# Patient Record
Sex: Female | Born: 1955 | Race: White | Hispanic: No | Smoking: Never smoker
Health system: Southern US, Community
[De-identification: ages and names within clinical notes are randomized; demographics above are authoritative.]

## PROBLEM LIST (undated history)

## (undated) DIAGNOSIS — R51 Headache: Secondary | ICD-10-CM

## (undated) DIAGNOSIS — K219 Gastro-esophageal reflux disease without esophagitis: Secondary | ICD-10-CM

## (undated) DIAGNOSIS — C801 Malignant (primary) neoplasm, unspecified: Secondary | ICD-10-CM

## (undated) DIAGNOSIS — Z1371 Encounter for nonprocreative screening for genetic disease carrier status: Secondary | ICD-10-CM

## (undated) DIAGNOSIS — T4145XA Adverse effect of unspecified anesthetic, initial encounter: Secondary | ICD-10-CM

## (undated) DIAGNOSIS — T8859XA Other complications of anesthesia, initial encounter: Secondary | ICD-10-CM

## (undated) DIAGNOSIS — I10 Essential (primary) hypertension: Secondary | ICD-10-CM

## (undated) DIAGNOSIS — K635 Polyp of colon: Secondary | ICD-10-CM

## (undated) DIAGNOSIS — M1990 Unspecified osteoarthritis, unspecified site: Secondary | ICD-10-CM

## (undated) DIAGNOSIS — Z8041 Family history of malignant neoplasm of ovary: Secondary | ICD-10-CM

## (undated) DIAGNOSIS — R112 Nausea with vomiting, unspecified: Secondary | ICD-10-CM

## (undated) DIAGNOSIS — Z9889 Other specified postprocedural states: Secondary | ICD-10-CM

## (undated) DIAGNOSIS — Z800 Family history of malignant neoplasm of digestive organs: Secondary | ICD-10-CM

## (undated) DIAGNOSIS — R519 Headache, unspecified: Secondary | ICD-10-CM

## (undated) HISTORY — PX: TUBAL LIGATION: SHX77

## (undated) HISTORY — DX: Encounter for nonprocreative screening for genetic disease carrier status: Z13.71

## (undated) HISTORY — PX: BACK SURGERY: SHX140

## (undated) HISTORY — PX: BREAST SURGERY: SHX581

## (undated) HISTORY — DX: Family history of malignant neoplasm of digestive organs: Z80.0

## (undated) HISTORY — DX: Polyp of colon: K63.5

## (undated) HISTORY — PX: REDUCTION MAMMAPLASTY: SUR839

## (undated) HISTORY — DX: Family history of malignant neoplasm of ovary: Z80.41

---

## 1998-12-16 ENCOUNTER — Other Ambulatory Visit: Admission: RE | Admit: 1998-12-16 | Discharge: 1998-12-16 | Payer: Self-pay | Admitting: Plastic Surgery

## 2002-07-14 ENCOUNTER — Encounter: Payer: Self-pay | Admitting: Neurosurgery

## 2002-07-14 ENCOUNTER — Encounter: Admission: RE | Admit: 2002-07-14 | Discharge: 2002-07-14 | Payer: Self-pay | Admitting: Neurosurgery

## 2002-08-06 ENCOUNTER — Encounter: Admission: RE | Admit: 2002-08-06 | Discharge: 2002-08-06 | Payer: Self-pay | Admitting: Neurosurgery

## 2002-08-06 ENCOUNTER — Encounter: Payer: Self-pay | Admitting: Neurosurgery

## 2002-08-21 ENCOUNTER — Encounter: Admission: RE | Admit: 2002-08-21 | Discharge: 2002-08-21 | Payer: Self-pay | Admitting: Neurosurgery

## 2002-08-21 ENCOUNTER — Encounter: Payer: Self-pay | Admitting: Neurosurgery

## 2004-10-01 HISTORY — PX: LEEP: SHX91

## 2004-12-13 ENCOUNTER — Ambulatory Visit: Payer: Self-pay | Admitting: Gynecologic Oncology

## 2005-11-27 ENCOUNTER — Ambulatory Visit: Payer: Self-pay

## 2006-12-10 ENCOUNTER — Ambulatory Visit: Payer: Self-pay

## 2007-11-24 ENCOUNTER — Ambulatory Visit: Payer: Self-pay | Admitting: Gastroenterology

## 2007-12-12 ENCOUNTER — Ambulatory Visit: Payer: Self-pay

## 2009-03-11 ENCOUNTER — Ambulatory Visit: Payer: Self-pay

## 2010-09-13 ENCOUNTER — Ambulatory Visit: Payer: Self-pay

## 2011-11-13 ENCOUNTER — Ambulatory Visit: Payer: Self-pay

## 2012-11-27 ENCOUNTER — Ambulatory Visit: Payer: Self-pay | Admitting: Internal Medicine

## 2014-05-13 ENCOUNTER — Ambulatory Visit: Payer: Self-pay

## 2016-03-01 ENCOUNTER — Emergency Department: Payer: BC Managed Care – PPO

## 2016-03-01 ENCOUNTER — Emergency Department
Admission: EM | Admit: 2016-03-01 | Discharge: 2016-03-01 | Disposition: A | Discharge status: Home / Self Care | Payer: BC Managed Care – PPO | Attending: Emergency Medicine | Admitting: Emergency Medicine

## 2016-03-01 ENCOUNTER — Encounter: Payer: Self-pay | Admitting: Emergency Medicine

## 2016-03-01 DIAGNOSIS — K8050 Calculus of bile duct without cholangitis or cholecystitis without obstruction: Secondary | ICD-10-CM | POA: Diagnosis not present

## 2016-03-01 DIAGNOSIS — K8020 Calculus of gallbladder without cholecystitis without obstruction: Secondary | ICD-10-CM | POA: Diagnosis not present

## 2016-03-01 DIAGNOSIS — I10 Essential (primary) hypertension: Secondary | ICD-10-CM | POA: Diagnosis not present

## 2016-03-01 DIAGNOSIS — R1011 Right upper quadrant pain: Secondary | ICD-10-CM | POA: Diagnosis present

## 2016-03-01 HISTORY — DX: Essential (primary) hypertension: I10

## 2016-03-01 LAB — URINALYSIS COMPLETE WITH MICROSCOPIC (ARMC ONLY)
BILIRUBIN URINE: NEGATIVE
Bacteria, UA: NONE SEEN
GLUCOSE, UA: NEGATIVE mg/dL
KETONES UR: NEGATIVE mg/dL
NITRITE: NEGATIVE
PROTEIN: NEGATIVE mg/dL
SPECIFIC GRAVITY, URINE: 1.013 (ref 1.005–1.030)
pH: 6 (ref 5.0–8.0)

## 2016-03-01 LAB — COMPREHENSIVE METABOLIC PANEL
ALT: 23 U/L (ref 14–54)
AST: 22 U/L (ref 15–41)
Albumin: 4.3 g/dL (ref 3.5–5.0)
Alkaline Phosphatase: 55 U/L (ref 38–126)
Anion gap: 9 (ref 5–15)
BILIRUBIN TOTAL: 0.7 mg/dL (ref 0.3–1.2)
BUN: 11 mg/dL (ref 6–20)
CHLORIDE: 108 mmol/L (ref 101–111)
CO2: 23 mmol/L (ref 22–32)
CREATININE: 0.67 mg/dL (ref 0.44–1.00)
Calcium: 9 mg/dL (ref 8.9–10.3)
Glucose, Bld: 96 mg/dL (ref 65–99)
Potassium: 3.5 mmol/L (ref 3.5–5.1)
Sodium: 140 mmol/L (ref 135–145)
TOTAL PROTEIN: 7.3 g/dL (ref 6.5–8.1)

## 2016-03-01 LAB — CBC
HCT: 41.6 % (ref 35.0–47.0)
Hemoglobin: 14 g/dL (ref 12.0–16.0)
MCH: 28.4 pg (ref 26.0–34.0)
MCHC: 33.7 g/dL (ref 32.0–36.0)
MCV: 84.3 fL (ref 80.0–100.0)
PLATELETS: 320 10*3/uL (ref 150–440)
RBC: 4.93 MIL/uL (ref 3.80–5.20)
RDW: 13.6 % (ref 11.5–14.5)
WBC: 7.2 10*3/uL (ref 3.6–11.0)

## 2016-03-01 LAB — LIPASE, BLOOD: LIPASE: 34 U/L (ref 11–51)

## 2016-03-01 MED ORDER — HYDROCODONE-ACETAMINOPHEN 5-325 MG PO TABS: 1.0000 | ORAL_TABLET | ORAL | Status: DC | PRN

## 2016-03-01 MED ORDER — DOCUSATE SODIUM 100 MG PO CAPS: ORAL_CAPSULE | ORAL | Status: DC

## 2016-03-01 MED ORDER — ONDANSETRON HCL 4 MG PO TABS
ORAL_TABLET | ORAL | Status: DC
Start: 2016-03-01 — End: 2016-04-27

## 2016-03-01 NOTE — ED Notes (Signed)
Pt here with c/o RUQ abd pain worsening over the past week, denies N,V,D. Does have her gallbladder. States she also bumped her leg a few times up against the bed last night, and today, the front of her right leg feels numb. Appears in NAD.

## 2016-03-01 NOTE — Discharge Instructions (Signed)
You have been seen in the Emergency Department (ED) for abdominal pain.  Your evaluation suggests that your pain is caused by gallstones.  Fortunately you do not need immediate surgery at this time, but it is important that you follow up with a surgeon as an outpatient; typically surgical removal of the gallbladder is the only thing that will definitively fix your issue.  Read through the included information about a bland diet, and use any prescribed medications as instructed.  Avoid smoking and alcohol use.  Please follow up as instructed above regarding todays emergent visit and the symptoms that are bothering you.  Take Norco as prescribed. Do not drink alcohol, drive or participate in any other potentially dangerous activities while taking this medication as it may make you sleepy. Do not take this medication with any other sedating medications, either prescription or over-the-counter. If you were prescribed Percocet or Vicodin, do not take these with acetaminophen (Tylenol) as it is already contained within these medications.   This medication is an opiate (or narcotic) pain medication and can be habit forming.  Use it as little as possible to achieve adequate pain control.  Do not use or use it with extreme caution if you have a history of opiate abuse or dependence.  If you are on a pain contract with your primary care doctor or a pain specialist, be sure to let them know you were prescribed this medication today from the [facility] Emergency Department.  This medication is intended for your use only - do not give any to anyone else and keep it in a secure place where nobody else, especially children, have access to it.  It will also cause or worsen constipation, so you may want to consider taking an over-the-counter stool softener while you are taking this medication.  Return to the ED if your abdominal pain worsens or fails to improve, you develop bloody vomiting, bloody diarrhea, you are  unable to tolerate fluids due to vomiting, fever greater than 101, or other symptoms that concern you.   Biliary Colic Biliary colic is a pain in the upper abdomen. The pain:  Is usually felt on the right side of the abdomen, but it may also be felt in the center of the abdomen, just below the breastbone (sternum).  May spread back toward the right shoulder blade.  May be steady or irregular.  May be accompanied by nausea and vomiting. Most of the time, the pain goes away in 1-5 hours. After the most intense pain passes, the abdomen may continue to ache mildly for about 24 hours. Biliary colic is caused by a blockage in the bile duct. The bile duct is a pathway that carries bile--a liquid that helps to digest fats--from the gallbladder to the small intestine. Biliary colic usually occurs after eating, when the digestive system demands bile. The pain develops when muscle cells contract forcefully to try to move the blockage so that bile can get by. HOME CARE INSTRUCTIONS  Take medicines only as directed by your health care provider.  Drink enough fluid to keep your urine clear or pale yellow.  Avoid fatty, greasy, and fried foods. These kinds of foods increase your body's demand for bile.  Avoid any foods that make your pain worse.  Avoid overeating.  Avoid having a large meal after fasting. SEEK MEDICAL CARE IF:  You develop a fever.  Your pain gets worse.  You vomit.  You develop nausea that prevents you from eating and drinking. SEEK  IMMEDIATE MEDICAL CARE IF:  You suddenly develop a fever and shaking chills.  You develop a yellowish discoloration (jaundice) of:  Skin.  Whites of the eyes.  Mucous membranes.  You have continuous or severe pain that is not relieved with medicines.  You have nausea and vomiting that is not relieved with medicines.  You develop dizziness or you faint.   This information is not intended to replace advice given to you by your  health care provider. Make sure you discuss any questions you have with your health care provider.   Document Released: 02/18/2006 Document Revised: 02/01/2015 Document Reviewed: 06/29/2014 Elsevier Interactive Patient Education 2016 [employer].  Cholelithiasis Cholelithiasis (also called gallstones) is a form of gallbladder disease in which gallstones form in your gallbladder. The gallbladder is an organ that stores bile made in the liver, which helps digest fats. Gallstones begin as small crystals and slowly grow into stones. Gallstone pain occurs when the gallbladder spasms and a gallstone is blocking the duct. Pain can also occur when a stone passes out of the duct.  RISK FACTORS  Being female.   Having multiple pregnancies. Health care providers sometimes advise removing diseased gallbladders before future pregnancies.   Being obese.  Eating a diet heavy in fried foods and fat.   Being older than 1 years and increasing age.   Prolonged use of medicines containing female hormones.   Having diabetes mellitus.   Rapidly losing weight.   Having a family history of gallstones (heredity).  SYMPTOMS  Nausea.   Vomiting.  Abdominal pain.   Yellowing of the skin (jaundice).   Sudden pain. It may persist from several minutes to several hours.  Fever.   Tenderness to the touch. In some cases, when gallstones do not move into the bile duct, people have no pain or symptoms. These are called "silent" gallstones.  TREATMENT Silent gallstones do not need treatment. In severe cases, emergency surgery may be required. Options for treatment include:  Surgery to remove the gallbladder. This is the most common treatment.  Medicines. These do not always work and may take 6-12 months or more to work.  Shock wave treatment (extracorporeal biliary lithotripsy). In this treatment an ultrasound machine sends shock waves to the gallbladder to break gallstones into smaller  pieces that can pass into the intestines or be dissolved by medicine. HOME CARE INSTRUCTIONS   Only take over-the-counter or prescription medicines for pain, discomfort, or fever as directed by your health care provider.   Follow a low-fat diet until seen again by your health care provider. Fat causes the gallbladder to contract, which can result in pain.   Follow up with your health care provider as directed. Attacks are almost always recurrent and surgery is usually required for permanent treatment.  SEEK IMMEDIATE MEDICAL CARE IF:   Your pain increases and is not controlled by medicines.   You have a fever or persistent symptoms for more than 2-3 days.   You have a fever and your symptoms suddenly get worse.   You have persistent nausea and vomiting.  MAKE SURE YOU:   Understand these instructions.  Will watch your condition.  Will get help right away if you are not doing well or get worse.   This information is not intended to replace advice given to you by your health care provider. Make sure you discuss any questions you have with your health care provider.   Document Released: 09/13/2005 Document Revised: 05/20/2013 Document Reviewed: 03/11/2013 Elsevier Interactive  Patient Education 2016 [employer].

## 2016-03-01 NOTE — ED Provider Notes (Signed)
[facility] Emergency Department Provider Note  ____________________________________________  Time seen: Approximately 7:13 PM  I have reviewed the triage vital signs and the nursing notes.   HISTORY  Chief Complaint Abdominal Pain    HPI Betty Parsons is a 60 y.o. female with no significant past medical history who presents for evaluation of right upper quadrant and epigastric abdominal pain that has been constant for the last 3 days.  She reports that she has not felt "quite right" for several weeks intermittently but that the pain started gradually 3 days ago and has been constant.  She feels worse when she is seated and slightly better when she lies down.  She feels bloated like she has eaten a big meal but she has had decreased appetite.  She denies nausea, vomiting, fever/chills, chest pain, shortness of breath, diarrhea, constipation, dysuria.  She describes the pain as moderate in intensity and describes it as both a dull ache and sometimes a burning sensation.  Her only other symptom that she mentioned is some numbness in the anterior portion of her right lower leg that has been new over the last several days.  She has not had any numbness or tingling in her arms and has had no weakness in any of her extremities.   Past Medical History  Diagnosis Date  . Hypertension     There are no active problems to display for this patient.   Past Surgical History  Procedure Laterality Date  . Back surgery    . Breast surgery      Current Outpatient Rx  Name  Route  Sig  Dispense  Refill  . docusate sodium (COLACE) 100 MG capsule      Take 1 tablet once or twice daily as needed for constipation while taking narcotic pain medicine   30 capsule   0   . HYDROcodone-acetaminophen (NORCO/VICODIN) 5-325 MG tablet   Oral   Take 1-2 tablets by mouth every 4 (four) hours as needed for moderate pain.   15 tablet   0   . ondansetron (ZOFRAN) 4 MG  tablet      Take 1-2 tabs by mouth every 8 hours as needed for nausea/vomiting   30 tablet   0     Allergies Review of patient's allergies indicates no known allergies.  No family history on file.  Social History Social History  Substance Use Topics  . Smoking status: Never Smoker   . Smokeless tobacco: None  . Alcohol Use: Yes     Comment: occas.     Review of Systems Constitutional: No fever/chills Eyes: No visual changes. ENT: No sore throat. Cardiovascular: Denies chest pain. Respiratory: Denies shortness of breath. Gastrointestinal: +abdominal pain.  No nausea, no vomiting.  No diarrhea.  No constipation. Genitourinary: Negative for dysuria. Musculoskeletal: Negative for back pain. Skin: Negative for rash. Neurological: Some numbness in her right lower anterior leg, otherwise no focal neuro deficits  10-point ROS otherwise negative.  ____________________________________________   PHYSICAL EXAM:  VITAL SIGNS: ED Triage Vitals  Enc Vitals Group     BP 03/01/16 1828 174/94 mmHg     Pulse Rate 03/01/16 1828 85     Resp 03/01/16 1828 18     Temp 03/01/16 1828 98.3 F (36.8 C)     Temp Source 03/01/16 1828 Oral     SpO2 03/01/16 1828 97 %     Weight 03/01/16 1828 180 lb (81.647 kg)     Height 03/01/16 1828  5\' 5"  (1.651 m)     Head Cir --      Peak Flow --      Pain Score 03/01/16 1829 7     Pain Loc --      Pain Edu? --      Excl. in [place]? --     Constitutional: Alert and oriented. Well appearing and in no acute distress. Eyes: Conjunctivae are normal. PERRL. EOMI. Head: Atraumatic. Nose: No congestion/rhinnorhea. Mouth/Throat: Mucous membranes are moist.  Oropharynx non-erythematous. Neck: No stridor.  No meningeal signs.   Cardiovascular: Normal rate, regular rhythm. Good peripheral circulation. Grossly normal heart sounds.   Respiratory: Normal respiratory effort.  No retractions. Lungs CTAB. Gastrointestinal: Tenderness to palpation of the  epigastrium and some to the right upper quadrant but no positive Murphy sign to speak of.  No lower abdominal tenderness. Musculoskeletal: No lower extremity tenderness nor edema. No gross deformities of extremities. Neurologic:  Normal speech and language. No gross focal neurologic deficits are appreciated.  Skin:  Skin is warm, dry and intact. No rash noted. Psychiatric: Mood and affect are normal. Speech and behavior are normal.  ____________________________________________   LABS (all labs ordered are listed, but only abnormal results are displayed)  Labs Reviewed  URINALYSIS COMPLETEWITH MICROSCOPIC ([place]) - Abnormal; Notable for the following:    Color, Urine YELLOW (*)    APPearance CLEAR (*)    Hgb urine dipstick 1+ (*)    Leukocytes, UA TRACE (*)    Squamous Epithelial / LPF 0-5 (*)    All other components within normal limits  LIPASE, BLOOD  COMPREHENSIVE METABOLIC PANEL  CBC   ____________________________________________  EKG  ED ECG REPORT I, Fremon Zacharia, the attending physician, personally viewed and interpreted this ECG.  Date: 03/01/2016 EKG Time: 18:32 Rate: 69 Rhythm: normal sinus rhythm QRS Axis: normal Intervals: normal ST/T Wave abnormalities: normal Conduction Disturbances: none Narrative Interpretation: unremarkable  ____________________________________________  RADIOLOGY   US Abdomen Complete  03/01/2016  CLINICAL DATA:  Epigastric and right upper quadrant pain. EXAM: ABDOMEN ULTRASOUND COMPLETE COMPARISON:  None. FINDINGS: Gallbladder: Multiple dependent gallstones with some sludge. No wall thickening or pericholecystic fluid. No evidence of acute cholecystitis. Common bile duct: Diameter: 4.7 mm Liver: Increased echogenicity.  No mass or focal lesion. IVC: No abnormality visualized. Pancreas: Visualized portion unremarkable. Spleen: Size and appearance within normal limits. Right Kidney: Length: 10.5 cm. Normal parenchymal echogenicity. 1  cm lower pole cyst. 4 mm midpole nonobstructing stone. No hydronephrosis. Left Kidney: Length: 11.3 cm. Normal parenchymal echogenicity. Mild dilation of the left renal pelvis. No mass or stone. Abdominal aorta: No aneurysm.  Atherosclerotic irregularity noted. Other findings: None. IMPRESSION: 1. No acute findings. No findings to explain epigastric and right upper quadrant pain. 2. Cholelithiasis without evidence of acute cholecystitis. 3. Hepatic steatosis. 4. Normal caliber abdominal aorta. 5. Small right renal cyst and intrarenal calcification. Mild dilation of the left renal pelvis, likely physiologic. Electronically Signed   By: Lajean Manes M.D.   On: 03/01/2016 21:09    ____________________________________________   PROCEDURES  Procedure(s) performed: None  Critical Care performed: No ____________________________________________   INITIAL IMPRESSION / ASSESSMENT AND PLAN / ED COURSE  Pertinent labs & imaging results that were available during my care of the patient were reviewed by me and considered in my medical decision making (see chart for details).  By far the top by tomorrow my differential diagnosis is gallbladder disease.  Labs unremarkable.  Will evaluate with ultrasound, but will also  check aorta given (likely unrelated) leg numbness.  ----------------------------------------- 9:30 PM on 03/01/2016 -----------------------------------------  Gallstones w/ sludge, but no cholecystitis.  No suggestion of choledocholithiasis.    Patient comfortable, no pain currently.  Had my usual and customary discussion with the patient.  She will follow up with surgery.     ____________________________________________  FINAL CLINICAL IMPRESSION(S) / ED DIAGNOSES  Final diagnoses:  Biliary colic  Cholelithiasis without cholecystitis     MEDICATIONS GIVEN DURING THIS VISIT:  Medications - No data to display   NEW OUTPATIENT MEDICATIONS STARTED DURING THIS VISIT:  New  Prescriptions   DOCUSATE SODIUM (COLACE) 100 MG CAPSULE    Take 1 tablet once or twice daily as needed for constipation while taking narcotic pain medicine   HYDROCODONE-ACETAMINOPHEN (NORCO/VICODIN) 5-325 MG TABLET    Take 1-2 tablets by mouth every 4 (four) hours as needed for moderate pain.   ONDANSETRON (ZOFRAN) 4 MG TABLET    Take 1-2 tabs by mouth every 8 hours as needed for nausea/vomiting      Note:  This document was prepared using Dragon voice recognition software and may include unintentional dictation errors.   Hinda Kehr, MD 03/01/16 2131

## 2016-03-01 NOTE — ED Notes (Signed)
Pt presents with RUQ pain in last couple of weeks, worsening in last few days. Pt reports feeling bloated, like she has eaten a big meal, but she has not been eating. Pt has gallbladder. Pt alert & oriented with NAD Noted.

## 2016-03-07 ENCOUNTER — Telehealth: Payer: Self-pay

## 2016-03-07 NOTE — Telephone Encounter (Signed)
Call to patient to schedule a follow-up appointment from Emergency Room Visit (6/1) per Dr. Azalee Course. Patient states that she has already followed up with PCP, Dr. Emily Filbert and they are going to "watch and see" right now. I explained to patient that we would be glad to see her if she needs any follow-up in the future for her gallstones.

## 2016-03-10 ENCOUNTER — Emergency Department
Admission: EM | Admit: 2016-03-10 | Discharge: 2016-03-10 | Disposition: A | Discharge status: Home / Self Care | Payer: BC Managed Care – PPO | Attending: Emergency Medicine | Admitting: Emergency Medicine

## 2016-03-10 DIAGNOSIS — Z79899 Other long term (current) drug therapy: Secondary | ICD-10-CM | POA: Insufficient documentation

## 2016-03-10 DIAGNOSIS — I10 Essential (primary) hypertension: Secondary | ICD-10-CM | POA: Diagnosis not present

## 2016-03-10 DIAGNOSIS — Z048 Encounter for examination and observation for other specified reasons: Secondary | ICD-10-CM | POA: Diagnosis present

## 2016-03-10 NOTE — ED Notes (Signed)
Pt states she had her bp meds changed on Tuesday and placed on zoloft for anxiety. She checks her bp approx 3 times a day. Checked her bp while at a store and noticed the bottom number was high. vss in room.

## 2016-03-10 NOTE — Discharge Instructions (Signed)
As we discussed, though you do have high blood pressure (hypertension), fortunately it is not immediately dangerous at this time and does not need emergency intervention or admission to the hospital.  If we add to or change your regular medications, we may cause more harm than good - it is more appropriate for your primary care doctor to evaluate you in clinic and decide if any medication changes are needed.  Please follow up in clinic as recommended in these papers. ° °Return to the Emergency Department (ED) if you experience any chest pain/pressure/tightness, difficulty breathing, or sudden sweating, or other symptoms that concern you. ° ° °Hypertension °Hypertension, commonly called high blood pressure, is when the force of blood pumping through your arteries is too strong. Your arteries are the blood vessels that carry blood from your heart throughout your body. A blood pressure reading consists of a higher number over a lower number, such as 110/72. The higher number (systolic) is the pressure inside your arteries when your heart pumps. The lower number (diastolic) is the pressure inside your arteries when your heart relaxes. Ideally you want your blood pressure below 120/80. °Hypertension forces your heart to work harder to pump blood. Your arteries may become narrow or stiff. Having untreated or uncontrolled hypertension can cause heart attack, stroke, kidney disease, and other problems. °RISK FACTORS °Some risk factors for high blood pressure are controllable. Others are not.  °Risk factors you cannot control include:  °· Race. You may be at higher risk if you are African American. °· Age. Risk increases with age. °· Gender. Men are at higher risk than women before age 45 years. After age 65, women are at higher risk than men. °Risk factors you can control include: °· Not getting enough exercise or physical activity. °· Being overweight. °· Getting too much fat, sugar, calories, or salt in your  diet. °· Drinking too much alcohol. °SIGNS AND SYMPTOMS °Hypertension does not usually cause signs or symptoms. Extremely high blood pressure (hypertensive crisis) may cause headache, anxiety, shortness of breath, and nosebleed. °DIAGNOSIS °To check if you have hypertension, your health care provider will measure your blood pressure while you are seated, with your arm held at the level of your heart. It should be measured at least twice using the same arm. Certain conditions can cause a difference in blood pressure between your right and left arms. A blood pressure reading that is higher than normal on one occasion does not mean that you need treatment. If it is not clear whether you have high blood pressure, you may be asked to return on a different day to have your blood pressure checked again. Or, you may be asked to monitor your blood pressure at home for 1 or more weeks. °TREATMENT °Treating high blood pressure includes making lifestyle changes and possibly taking medicine. Living a healthy lifestyle can help lower high blood pressure. You may need to change some of your habits. °Lifestyle changes may include: °· Following the DASH diet. This diet is high in fruits, vegetables, and whole grains. It is low in salt, red meat, and added sugars. °· Keep your sodium intake below 2,300 mg per day. °· Getting at least 30-45 minutes of aerobic exercise at least 4 times per week. °· Losing weight if necessary. °· Not smoking. °· Limiting alcoholic beverages. °· Learning ways to reduce stress. °Your health care provider may prescribe medicine if lifestyle changes are not enough to get your blood pressure under control, and if one of   the following is true: °· You are 18-59 years of age and your systolic blood pressure is above 140. °· You are 60 years of age or older, and your systolic blood pressure is above 150. °· Your diastolic blood pressure is above 90. °· You have diabetes, and your systolic blood pressure is over  140 or your diastolic blood pressure is over 90. °· You have kidney disease and your blood pressure is above 140/90. °· You have heart disease and your blood pressure is above 140/90. °Your personal target blood pressure may vary depending on your medical conditions, your age, and other factors. °HOME CARE INSTRUCTIONS °· Have your blood pressure rechecked as directed by your health care provider.   °· Take medicines only as directed by your health care provider. Follow the directions carefully. Blood pressure medicines must be taken as prescribed. The medicine does not work as well when you skip doses. Skipping doses also puts you at risk for problems. °· Do not smoke.   °· Monitor your blood pressure at home as directed by your health care provider.  °SEEK MEDICAL CARE IF:  °· You think you are having a reaction to medicines taken. °· You have recurrent headaches or feel dizzy. °· You have swelling in your ankles. °· You have trouble with your vision. °SEEK IMMEDIATE MEDICAL CARE IF: °· You develop a severe headache or confusion. °· You have unusual weakness, numbness, or feel faint. °· You have severe chest or abdominal pain. °· You vomit repeatedly. °· You have trouble breathing. °MAKE SURE YOU:  °· Understand these instructions. °· Will watch your condition. °· Will get help right away if you are not doing well or get worse. °  °This information is not intended to replace advice given to you by your health care provider. Make sure you discuss any questions you have with your health care provider. °  °Document Released: 09/17/2005 Document Revised: 02/01/2015 Document Reviewed: 07/10/2013 °Elsevier Interactive Patient Education ©2016 [employer] ° °

## 2016-03-10 NOTE — ED Notes (Addendum)
Patient reports recently started a new blood pressure medication last week and medication for anxiety.  This evening noticed blood pressure was elevated and has become more anxious.  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

## 2016-03-10 NOTE — ED Provider Notes (Signed)
[facility] Emergency Department Provider Note  ____________________________________________  Time seen: Approximately 9:40 PM  I have reviewed the triage vital signs and the nursing notes.   HISTORY  Chief Complaint Hypertension    HPI Betty Parsons is a 60 y.o. female presents for elevated blood pressure. She's been working with Dr. Sabra Heck regarding high blood pressure recently. She was just started on new hydrochlorothiazide last week as well as an anti-anxiety/Zoloft yesterday by Dr. Sabra Heck.  Patient reports that at home she is been checking her blood pressures 3-4 times a day and today she was noticing occasionally 160s over 100. This prompted concern for her that she does not wish to "have a stroke" and she is comfortable evaluation. She has not had a headache, numbness, tingling, chest pain weakness or other concerns but just reports her blood pressure has been high on multiple readings. She has however only been taking a half tablet of hydrochlorothiazide as her blood pressure was low just a few days ago after she had seen Dr. Sabra Heck.  She does report being under stress, and starting Zoloft yesterday.   Past Medical History  Diagnosis Date  . Hypertension     There are no active problems to display for this patient.   Past Surgical History  Procedure Laterality Date  . Back surgery    . Breast surgery      Current Outpatient Rx  Name  Route  Sig  Dispense  Refill  . docusate sodium (COLACE) 100 MG capsule      Take 1 tablet once or twice daily as needed for constipation while taking narcotic pain medicine   30 capsule   0   . HYDROcodone-acetaminophen (NORCO/VICODIN) 5-325 MG tablet   Oral   Take 1-2 tablets by mouth every 4 (four) hours as needed for moderate pain.   15 tablet   0   . ondansetron (ZOFRAN) 4 MG tablet      Take 1-2 tabs by mouth every 8 hours as needed for nausea/vomiting   30 tablet   0      Allergies Review of patient's allergies indicates no known allergies.  No family history on file.  Social History Social History  Substance Use Topics  . Smoking status: Never Smoker   . Smokeless tobacco: Not on file  . Alcohol Use: Yes     Comment: occas.     Review of Systems Constitutional: No fever/chills Eyes: No visual changes. ENT: No sore throat. Cardiovascular: Denies chest pain. Respiratory: Denies shortness of breath. Gastrointestinal: No abdominal pain.  No nausea, no vomiting.  No diarrhea.  No constipation. Genitourinary: Negative for dysuria. Musculoskeletal: Negative for back pain. Skin: Negative for rash. Neurological: Negative for headaches, focal weakness or numbness.  10-point ROS otherwise negative.  ____________________________________________   PHYSICAL EXAM:  VITAL SIGNS: ED Triage Vitals  Enc Vitals Group     BP 03/10/16 2007 169/102 mmHg     Pulse Rate 03/10/16 2007 71     Resp 03/10/16 2007 18     Temp 03/10/16 2007 98.2 F (36.8 C)     Temp Source 03/10/16 2007 Oral     SpO2 03/10/16 2007 97 %     Weight 03/10/16 2007 175 lb (79.379 kg)     Height 03/10/16 2007 5\' 5"  (1.651 m)     Head Cir --      Peak Flow --      Pain Score --      Pain Loc --  Pain Edu? --      Excl. in [place]? --    Constitutional: Alert and oriented. Well appearing and in no acute distress. Eyes: Conjunctivae are normal. PERRL. EOMI. Head: Atraumatic. Nose: No congestion/rhinnorhea. Mouth/Throat: Mucous membranes are moist.  Oropharynx non-erythematous. Neck: No stridor.   Cardiovascular: Normal rate, regular rhythm. Grossly normal heart sounds.  Good peripheral circulation. Respiratory: Normal respiratory effort.  No retractions. Lungs CTAB. Gastrointestinal: Soft and nontender. No distention. Had abdominal pain last week, this is improved now. Musculoskeletal: No lower extremity tenderness nor edema.  No joint effusions. Neurologic:  Normal speech  and language. No gross focal neurologic deficits are appreciated. No gait instability. Skin:  Skin is warm, dry and intact. No rash noted. Psychiatric: Mood and affect are normal. Speech and behavior are normal.  ____________________________________________   LABS (all labs ordered are listed, but only abnormal results are displayed)  Labs Reviewed - No data to display ____________________________________________  EKG  ED ECG REPORT I, QUALE, MARK, the attending physician, personally viewed and interpreted this ECG.  Date: 03/10/2016 EKG Time: 2120 Rate: 75 Rhythm: normal sinus rhythm QRS Axis: normal Intervals: normal ST/T Wave abnormalities: normal Conduction Disturbances: none Narrative Interpretation: unremarkable  ____________________________________________  RADIOLOGY   ____________________________________________   PROCEDURES  Procedure(s) performed: None  Critical Care performed: No  ____________________________________________   INITIAL IMPRESSION / ASSESSMENT AND PLAN / ED COURSE  Pertinent labs & imaging results that were available during my care of the patient were reviewed by me and considered in my medical decision making (see chart for details).  Patient presents for evaluation of asymptomatic hypertension at home. Very reassuring exam, normal EKG and no signs or symptoms suggest need for further workup at this time as she is working with Dr. Sabra Heck regarding this. After discussing with the patient and reassurance her blood pressure improved on its own. She has no evidence of hypertensive emergency, no concerning symptoms. No neurologic cardiac or pulmonary symptomatology. I advised her to continue with the medications and adjustments Dr. Ammie Ferrier been making in the set up follow-up with him. The patient understands, is agreeable and seems very reassured after our discussion.  Nonetheless, we did discuss very careful return precautions for which the  patient is agreeable. Return precautions and treatment recommendations and follow-up discussed with the patient who is agreeable with the plan.  ____________________________________________   FINAL CLINICAL IMPRESSION(S) / ED DIAGNOSES  Final diagnoses:  Hypertension, essential      Delman Kitten, MD 03/10/16 2143

## 2016-04-27 ENCOUNTER — Encounter
Admission: RE | Admit: 2016-04-27 | Discharge: 2016-04-27 | Disposition: A | Discharge status: Home / Self Care | Payer: BC Managed Care – PPO | Source: Ambulatory Visit | Attending: Surgery | Admitting: Surgery

## 2016-04-27 HISTORY — DX: Other complications of anesthesia, initial encounter: T88.59XA

## 2016-04-27 HISTORY — DX: Adverse effect of unspecified anesthetic, initial encounter: T41.45XA

## 2016-04-27 HISTORY — DX: Headache: R51

## 2016-04-27 HISTORY — DX: Nausea with vomiting, unspecified: R11.2

## 2016-04-27 HISTORY — DX: Malignant (primary) neoplasm, unspecified: C80.1

## 2016-04-27 HISTORY — DX: Gastro-esophageal reflux disease without esophagitis: K21.9

## 2016-04-27 HISTORY — DX: Headache, unspecified: R51.9

## 2016-04-27 HISTORY — DX: Unspecified osteoarthritis, unspecified site: M19.90

## 2016-04-27 HISTORY — DX: Nausea with vomiting, unspecified: Z98.890

## 2016-04-27 NOTE — Patient Instructions (Signed)
  Your procedure is scheduled on: 05-04-16 Report to Same Day Surgery 2nd floor medical mall To find out your arrival time please call [phone] between 1PM - 3PM on 05-03-16  Remember: Instructions that are not followed completely may result in serious medical risk, up to and including death, or upon the discretion of your surgeon and anesthesiologist your surgery may need to be rescheduled.    _x___ 1. Do not eat food or drink liquids after midnight. No gum chewing or hard candies.     __x__ 2. No Alcohol for 24 hours before or after surgery.   __x__3. No Smoking for 24 prior to surgery.   ____  4. Bring all medications with you on the day of surgery if instructed.    __x__ 5. Notify your doctor if there is any change in your medical condition     (cold, fever, infections).     Do not wear jewelry, make-up, hairpins, clips or nail polish.  Do not wear lotions, powders, or perfumes. You may wear deodorant.  Do not shave 48 hours prior to surgery. Men may shave face and neck.  Do not bring valuables to the hospital.    [facility] is not responsible for any belongings or valuables.               Contacts, dentures or bridgework may not be worn into surgery.  Leave your suitcase in the car. After surgery it may be brought to your room.  For patients admitted to the hospital, discharge time is determined by your treatment team.   Patients discharged the day of surgery will not be allowed to drive home.    Please read over the following fact sheets that you were given:   [facility] Preparing for Surgery and or MRSA Information   _x___ Take these medicines the morning of surgery with A SIP OF WATER:    1. METOPROLOL  2. DULOXETINE  3. MAY TAKE XANAX IF NEEDED AM OF SURGERY  4.  5.  6.  ____ Fleet Enema (as directed)   ____ Use CHG Soap or sage wipes as directed on instruction sheet   ____ Use inhalers on the day of surgery and bring to hospital day of surgery  ____ Stop  metformin 2 days prior to surgery    ____ Take 1/2 of usual insulin dose the night before surgery and none on the morning of  surgery.   ____ Stop aspirin or coumadin, or plavix  _x__ Stop Anti-inflammatories such as Advil, Aleve, Ibuprofen, Motrin, Naproxen,          Naprosyn, Goodies powders or aspirin products. Ok to take Tylenol.   _X___ Stop supplements until after surgery-STOP MELATONIN NOW  ____ Bring C-Pap to the hospital.

## 2016-05-03 NOTE — Pre-Procedure Instructions (Signed)
Component Name Value Ref Range  LV Ejection Fraction (%) 55   Aortic Valve Regurgitation Grade trivial   Aortic Valve Stenosis Grade none   Aortic Valve Max Velocity (m/s) 1.4 m/sec   Aortic Valve Stenosis Mean Gradient (mmHg) 5.0 mmHg   Mitral Valve Regurgitation Grade trivial   Mitral Valve Stenosis Grade none   Tricuspid Valve Regurgitation Grade trivial   Result Narrative  INTERNAL MEDICINE DEPARTMENT SAPHYRA, BOST  [facility] [id number] [place] #: [id number] [place] Betty Parsons [id number]: 12/06/2015 08:35 AM  Adult Female Age: 60 yrs ECHOCARDIOGRAM REPORTOutpatient STUDY:Stress Echo TAPE:[id number]::[id number]  ECHO:Yes DOPPLER:Yes FILE:[id number]:MCLAUGHLIN, MIRIAM KLEM COLOR:YesCONTRAST:NoMACHINE:PhilipsHeight: 44 in RV BIOPSY:No 3D:No SOUND QLTY:Moderate Weight: 182 lb  MEDIUM:None  BSA: 1.9 m2 ___________________________________________________________________________________________ HISTORY:Hypertension, SOB  REASON:Assess, LV function  Indication:Acute chest pain, unspecified [R07.9 (ICD-10-CM)]  ___________________________________________________________________________________________ STRESS ECHOCARDIOGRAPHY   Protocol:Treadmill (Accelerated [place]) Drugs:None Target Heart Rate:135 bpmMaximum Predicted Heart Rate: 159 bpm  +-------------------+-------------------------+-------------------------+------------+  Stage  Duration (mm:ss) Heart Rate (bpm)  BP  +-------------------+-------------------------+-------------------------+------------+ RESTING 75  134/94  +-------------------+-------------------------+-------------------------+------------+ EXERCISE  4:28 146  / +-------------------+-------------------------+-------------------------+------------+ RECOVERY  5:2683  154/95  +-------------------+-------------------------+-------------------------+------------+  Stress Duration:9:54 mm:ss Max Stress H.R.:146 bpmTarget Heart Rate Achieved: Yes Maximum workload of 9.40 METS was achieved during exercise   ___________________________________________________________________________________________ WALL SEGMENT CHANGES  RestStress Anterior Septum Basal:NormalHyperkinetic [id number]  Apical:NormalHyperkinetic  Anterior Wall Basal:NormalHyperkinetic [id number]  Apical:NormalHyperkinetic   Lateral Wall Basal:NormalHyperkinetic [id number]  Apical:NormalHyperkinetic   Posterior Wall Basal:NormalHyperkinetic [id number]  Inferior Wall Basal:NormalHyperkinetic [id number]  Apical:NormalHyperkinetic  Inferior Septum Basal:NormalHyperkinetic [id number]   Resting EF:>55% (Est.) Stress EF: >55% (Est.)   ___________________________________________________________________________________________ ADDITIONAL  FINDINGS    ___________________________________________________________________________________________ STRESS ECG RESULTS   ECG Results:Normal  ___________________________________________________________________________________________  ECHOCARDIOGRAPHIC DESCRIPTIONS  LEFT VENTRICLE Size:Normal  Contraction:Normal  LV Masses:No Masses  [id number] Dias.FxClass:Normal  RIGHT VENTRICLE Size:Normal Free Wall:Normal  Contraction:Normal RV Masses:No mass  PERICARDIUM  Fluid:No effusion  _______________________________________________________________________________________  DOPPLER ECHO and OTHER SPECIAL PROCEDURES  Aortic:TRIVIAL AR No AS 137.0 cm/sec peak vel7.5 mmHg peak grad 5.0 mmHg mean grad   Mitral:TRIVIAL MR No MS MV Inflow E Vel=nm*MV Annulus E'Vel=nm* E/E'Ratio=nm*  Tricuspid:TRIVIAL TR No TS  Pulmonary:TRIVIAL PR No PS   ___________________________________________________________________________________________  ECHOCARDIOGRAPHIC MEASUREMENTS 2D DIMENSIONS AORTA ValuesNormal RangeMAIN PAValuesNormal Range Annulus:nm* [2.1 - 2.5]PA Main:nm* [1.5 - 2.1] Aorta Sin:nm* [2.7 - 3.3] RIGHT VENTRICLE ST Junction:nm* [2.3 - 2.9]RV Base:nm* [ < 4.2] Asc.Aorta:nm* [2.3 - 3.1] RV Mid:nm* [ < 3.5]  LEFT VENTRICLERV Length:nm* [ < 8.6] LVIDd:nm* [3.9 - 5.3] INFERIOR VENA CAVA LVIDs:nm* Max. IVC:nm* [ <= 2.1]  FS:nm* [>  25]Min. IVC:nm* SWT:nm* [0.5 - 0.9] ------------------ PWT:nm* [0.5 - 0.9] nm* - not measured  LEFT ATRIUM LA Diam:nm* [2.7 - 3.8] LA A4C Area:nm* [ < 20] LA Volume:nm* [22 - [id number]  ___________________________________________________________________________________________ INTERPRETATION Normal Stress Echocardiogram NORMAL RIGHT VENTRICULAR SYSTOLIC FUNCTION TRIVIAL REGURGITATION NOTED (See above) NO VALVULAR STENOSIS NOTED   ___________________________________________________________________________________________  Electronically signed by: Rusty Aus, MD on 12/07/2015 01:17 PM Performed By: Scherrie November, RCS Ordering Physician: Sheron Nightingale ___________________________________________________________________________________________  Status Results Details    Appointment on 12/06/2015 [place]")' href="[email or web address]/">Encounter Summary  ECG stress test only3/03/2016 [place] ECG stress test only3/03/2016 [place] Component Name Value Ref Range       Status Results Details    Appointment on 12/06/2015 [place]")' href="[email or web address]/">Encounter Summary  February ECG 12-lead2/24/2017 [facility] [place] ECG 12-lead2/24/2017 [place] Component Name Value Ref  Range  Vent Rate (bpm) 90   PR Interval (msec) 156   QRS Interval (msec) 82   QT Interval (msec) 352   QTc (msec) 430   Result Narrative  Normal sinus rhythm I reviewed and concur with this report. Electronically signed [id number] MD, [place] [id number]) on 11/29/2015 1:22:50 PM  Status Results Details    Office Visit on  11/25/2015 [place]")' href="[email or web address]/">Encounter Summary  2016 [place] ECG 12-lead6/05/2015 [place] ECG 12-lead6/05/2015 [place] Component Name Value Ref Range  Vent Rate (bpm) 85   PR Interval (msec) 142   QRS Interval (msec) 88   QT Interval (msec) 372   QTc (msec) 442   Result Narrative  Normal sinus rhythm Minimal voltage criteria for LVH, may be normal variant Borderline ECG No previous ECGs available I reviewed and concur with this report. Electronically signed [id number] MD, [place]y [place] ([id number]) on 03/09/2015 4:48:35 PM  Status Results Details    Office Visit on 03/09/2015 [place]")' href="[email or web address]/">Encounter Summary  ECG6/05/2015 [place] ECG6/05/2015 [facility] Result Narrative  Ordered by an unspecified provider.  Status Results Details    [place] Orders Only on 03/09/2015 [place]")' href="[email or web address]/">Encounter Summary  May ECG 12 lead5/29/2016 [facility] ECG 12 lead5/29/2016 [facility] Component Name Value Ref Range       Status Results Details    Office Visit on 02/27/2015 [place]")' href="[email or web address]/">Encounter Summary  X-ray chest 2 view (routine)02/27/2015 [facility] X-ray chest 2 view (routine)02/27/2015 [facility] Result Narrative  EXAM: XR CHEST 2 VIEW  HISTORY: Chest pain  COMPARISON: None  FINDINGS:  Calcified nodes seen left hilum and there is a calcified left lower lobe granuloma. Lungs otherwise clear. No pneumothorax or pleural effusion seen.  IMPRESSION:  No acute abnormality seen.   Dictated by Minette Headland,  M.D.   [id number]  Status Results Details    Office Visit on 02/27/2015 [place]")' href="[email or web address]/">Encounter Summary  Date Index Date Index 2017 2017 MarchFebruary 2016 2016 [facility] Result Index Result Index ECG ECG 03/09/2015 ECG 12 lead ECG 12 lead 02/27/2015 ECG 12-lead ECG 12-lead 2/24/20176/05/2015 ECG stress test only ECG stress test only 12/06/2015 Stress echo with physician supervision Stress echo with physician supervision 12/06/2015 X-ray chest 2 view (routine) X-ray chest 2 view (routine) 02/27/2015

## 2016-05-04 ENCOUNTER — Ambulatory Visit: Payer: BC Managed Care – PPO | Admitting: Certified Registered"

## 2016-05-04 ENCOUNTER — Encounter: Payer: Self-pay | Admitting: *Deleted

## 2016-05-04 ENCOUNTER — Ambulatory Visit: Payer: BC Managed Care – PPO

## 2016-05-04 ENCOUNTER — Ambulatory Visit
Admission: RE | Admit: 2016-05-04 | Discharge: 2016-05-04 | Disposition: A | Discharge status: Home / Self Care | Payer: BC Managed Care – PPO | Source: Ambulatory Visit | Attending: Surgery | Admitting: Surgery

## 2016-05-04 ENCOUNTER — Encounter
Admission: RE | Disposition: A | Discharge status: Home / Self Care | Payer: Self-pay | Source: Ambulatory Visit | Attending: Surgery

## 2016-05-04 DIAGNOSIS — Z8052 Family history of malignant neoplasm of bladder: Secondary | ICD-10-CM | POA: Insufficient documentation

## 2016-05-04 DIAGNOSIS — K219 Gastro-esophageal reflux disease without esophagitis: Secondary | ICD-10-CM | POA: Insufficient documentation

## 2016-05-04 DIAGNOSIS — R51 Headache: Secondary | ICD-10-CM | POA: Insufficient documentation

## 2016-05-04 DIAGNOSIS — K8010 Calculus of gallbladder with chronic cholecystitis without obstruction: Secondary | ICD-10-CM | POA: Diagnosis present

## 2016-05-04 DIAGNOSIS — Z79899 Other long term (current) drug therapy: Secondary | ICD-10-CM | POA: Insufficient documentation

## 2016-05-04 DIAGNOSIS — Z8249 Family history of ischemic heart disease and other diseases of the circulatory system: Secondary | ICD-10-CM | POA: Insufficient documentation

## 2016-05-04 DIAGNOSIS — Z419 Encounter for procedure for purposes other than remedying health state, unspecified: Secondary | ICD-10-CM

## 2016-05-04 DIAGNOSIS — K811 Chronic cholecystitis: Secondary | ICD-10-CM | POA: Insufficient documentation

## 2016-05-04 DIAGNOSIS — I10 Essential (primary) hypertension: Secondary | ICD-10-CM | POA: Insufficient documentation

## 2016-05-04 HISTORY — PX: CHOLECYSTECTOMY: SHX55

## 2016-05-04 SURGERY — LAPAROSCOPIC CHOLECYSTECTOMY
Anesthesia: General | Wound class: Contaminated

## 2016-05-04 MED ORDER — ONDANSETRON HCL 4 MG/2ML IJ SOLN: 4.0000 mg | Freq: Once | INTRAMUSCULAR | Status: DC | PRN

## 2016-05-04 MED ORDER — SUGAMMADEX SODIUM 200 MG/2ML IV SOLN
INTRAVENOUS | Status: DC | PRN
  Administered 2016-05-04: 155 mg via INTRAVENOUS

## 2016-05-04 MED ORDER — SODIUM CHLORIDE 0.9 % IV SOLN
INTRAVENOUS | Status: DC | PRN
  Administered 2016-05-04: 300 mL via INTRAMUSCULAR

## 2016-05-04 MED ORDER — ROCURONIUM BROMIDE 100 MG/10ML IV SOLN
INTRAVENOUS | Status: DC | PRN
  Administered 2016-05-04: 10 mg via INTRAVENOUS
  Administered 2016-05-04: 40 mg via INTRAVENOUS

## 2016-05-04 MED ORDER — LIDOCAINE HCL (CARDIAC) 20 MG/ML IV SOLN
INTRAVENOUS | Status: DC | PRN
  Administered 2016-05-04: 80 mg via INTRAVENOUS

## 2016-05-04 MED ORDER — HYDROCODONE-ACETAMINOPHEN 5-325 MG PO TABS
1.0000 | ORAL_TABLET | ORAL | Status: DC | PRN
  Administered 2016-05-04: 1 via ORAL

## 2016-05-04 MED ORDER — FAMOTIDINE 20 MG PO TABS
20.0000 mg | ORAL_TABLET | Freq: Once | ORAL | Status: AC
  Administered 2016-05-04: 20 mg via ORAL

## 2016-05-04 MED ORDER — ONDANSETRON HCL 4 MG/2ML IJ SOLN
INTRAMUSCULAR | Status: DC | PRN
  Administered 2016-05-04: 4 mg via INTRAVENOUS

## 2016-05-04 MED ORDER — FAMOTIDINE 20 MG PO TABS
ORAL_TABLET | ORAL | Status: AC
  Filled 2016-05-04: qty 1

## 2016-05-04 MED ORDER — DEXAMETHASONE SODIUM PHOSPHATE 10 MG/ML IJ SOLN
INTRAMUSCULAR | Status: DC | PRN
  Administered 2016-05-04: 4 mg via INTRAVENOUS

## 2016-05-04 MED ORDER — FENTANYL CITRATE (PF) 100 MCG/2ML IJ SOLN
INTRAMUSCULAR | Status: DC | PRN
  Administered 2016-05-04: 100 ug via INTRAVENOUS
  Administered 2016-05-04: 25 ug via INTRAVENOUS
  Administered 2016-05-04: 50 ug via INTRAVENOUS

## 2016-05-04 MED ORDER — LACTATED RINGERS IV SOLN
INTRAVENOUS | Status: DC
  Administered 2016-05-04: 11:00:00 via INTRAVENOUS

## 2016-05-04 MED ORDER — PROPOFOL 10 MG/ML IV BOLUS
INTRAVENOUS | Status: DC | PRN
  Administered 2016-05-04: 130 mg via INTRAVENOUS

## 2016-05-04 MED ORDER — HYDROCODONE-ACETAMINOPHEN 5-325 MG PO TABS: 1.0000 | ORAL_TABLET | ORAL | 0 refills | Status: DC | PRN

## 2016-05-04 MED ORDER — FENTANYL CITRATE (PF) 100 MCG/2ML IJ SOLN: 25.0000 ug | INTRAMUSCULAR | Status: DC | PRN

## 2016-05-04 MED ORDER — SODIUM CHLORIDE 0.9 % IJ SOLN
INTRAMUSCULAR | Status: AC
  Filled 2016-05-04: qty 50

## 2016-05-04 MED ORDER — HEPARIN SODIUM (PORCINE) 5000 UNIT/ML IJ SOLN
INTRAMUSCULAR | Status: AC
  Filled 2016-05-04: qty 1

## 2016-05-04 MED ORDER — MIDAZOLAM HCL 2 MG/2ML IJ SOLN
INTRAMUSCULAR | Status: DC | PRN
  Administered 2016-05-04: 2 mg via INTRAVENOUS

## 2016-05-04 MED ORDER — HYDROCODONE-ACETAMINOPHEN 5-325 MG PO TABS
ORAL_TABLET | ORAL | Status: AC
  Filled 2016-05-04: qty 1

## 2016-05-04 SURGICAL SUPPLY — 37 items
APPLIER CLIP ROT 10 11.4 M/L (STAPLE) ×3
CANISTER SUCT 1200ML W/VALVE (MISCELLANEOUS) ×3 IMPLANT
CANNULA DILATOR 10 W/SLV (CANNULA) ×2 IMPLANT
CANNULA DILATOR 10MM W/SLV (CANNULA) ×1
CATH REDDICK CHOLANGI 4FR 50CM (CATHETERS) ×3 IMPLANT
CHLORAPREP W/TINT 26ML (MISCELLANEOUS) ×3 IMPLANT
CLIP APPLIE ROT 10 11.4 M/L (STAPLE) ×1 IMPLANT
CLOSURE WOUND 1/2 X4 (GAUZE/BANDAGES/DRESSINGS) ×1
DRAPE SHEET LG 3/4 BI-LAMINATE (DRAPES) ×3 IMPLANT
ELECT REM PT RETURN 9FT ADLT (ELECTROSURGICAL) ×3
ELECTRODE REM PT RTRN 9FT ADLT (ELECTROSURGICAL) ×1 IMPLANT
GAUZE SPONGE 4X4 12PLY STRL (GAUZE/BANDAGES/DRESSINGS) ×3 IMPLANT
GLOVE BIO SURGEON STRL SZ7.5 (GLOVE) ×12 IMPLANT
GOWN STRL REUS W/ TWL LRG LVL3 (GOWN DISPOSABLE) ×3 IMPLANT
GOWN STRL REUS W/TWL LRG LVL3 (GOWN DISPOSABLE) ×6
IRRIGATION STRYKERFLOW (MISCELLANEOUS) ×1 IMPLANT
IRRIGATOR STRYKERFLOW (MISCELLANEOUS) ×3
IV NS 1000ML (IV SOLUTION) ×2
IV NS 1000ML BAXH (IV SOLUTION) ×1 IMPLANT
KIT RM TURNOVER STRD PROC AR (KITS) ×3 IMPLANT
LABEL OR SOLS (LABEL) ×3 IMPLANT
NDL INSUFF ACCESS 14 VERSASTEP (NEEDLE) ×3 IMPLANT
NEEDLE FILTER BLUNT 18X 1/2SAF (NEEDLE) ×2
NEEDLE FILTER BLUNT 18X1 1/2 (NEEDLE) ×1 IMPLANT
NS IRRIG 500ML POUR BTL (IV SOLUTION) ×3 IMPLANT
PACK LAP CHOLECYSTECTOMY (MISCELLANEOUS) ×3 IMPLANT
SCISSORS METZENBAUM CVD 33 (INSTRUMENTS) ×3 IMPLANT
SEAL FOR SCOPE WARMER C3101 (MISCELLANEOUS) ×3 IMPLANT
SLEEVE ENDOPATH XCEL 5M (ENDOMECHANICALS) ×3 IMPLANT
STRIP CLOSURE SKIN 1/2X4 (GAUZE/BANDAGES/DRESSINGS) ×2 IMPLANT
SUT CHROMIC 5 0 RB 1 27 (SUTURE) ×6 IMPLANT
SUT VIC AB 0 CT2 27 (SUTURE) IMPLANT
SYR 3ML LL SCALE MARK (SYRINGE) ×3 IMPLANT
TROCAR XCEL NON-BLD 11X100MML (ENDOMECHANICALS) ×3 IMPLANT
TROCAR XCEL NON-BLD 5MMX100MML (ENDOMECHANICALS) ×3 IMPLANT
TUBING INSUFFLATOR HI FLOW (MISCELLANEOUS) ×3 IMPLANT
WATER STERILE IRR 1000ML POUR (IV SOLUTION) ×3 IMPLANT

## 2016-05-04 NOTE — Discharge Instructions (Signed)

## 2016-05-04 NOTE — Anesthesia Preprocedure Evaluation (Signed)
Anesthesia Evaluation  Patient identified by MRN, date of birth, ID band Patient awake    Reviewed: Allergy & Precautions, NPO status , Patient's Chart, lab work & pertinent test results  History of Anesthesia Complications (+) PONV  Airway Mallampati: II       Dental  (+) Teeth Intact   Pulmonary neg pulmonary ROS,    breath sounds clear to auscultation       Cardiovascular Exercise Tolerance: Good hypertension, Pt. on home beta blockers  Rhythm:Regular     Neuro/Psych  Headaches,    GI/Hepatic Neg liver ROS, GERD  Medicated,  Endo/Other  negative endocrine ROS  Renal/GU negative Renal ROS     Musculoskeletal   Abdominal Normal abdominal exam  (+)   Peds  Hematology negative hematology ROS (+)   Anesthesia Other Findings   Reproductive/Obstetrics                             Anesthesia Physical Anesthesia Plan  ASA: II  Anesthesia Plan: General   Post-op Pain Management:    Induction: Intravenous  Airway Management Planned: Oral ETT  Additional Equipment:   Intra-op Plan:   Post-operative Plan: Extubation in OR  Informed Consent: I have reviewed the patients History and Physical, chart, labs and discussed the procedure including the risks, benefits and alternatives for the proposed anesthesia with the patient or authorized representative who has indicated his/her understanding and acceptance.     Plan Discussed with: CRNA  Anesthesia Plan Comments:         Anesthesia Quick Evaluation

## 2016-05-04 NOTE — OR Nursing (Signed)
Dr.Smith discontinued the order for the cholangiogram. X-ray staff notified.

## 2016-05-04 NOTE — Transfer of Care (Signed)
Immediate Anesthesia Transfer of Care Note  Patient: Betty Parsons  Procedure(s) Performed: Procedure(s): LAPAROSCOPIC CHOLECYSTECTOMY (N/A)  Patient Location: PACU  Anesthesia Type:General  Level of Consciousness: sedated  Airway & Oxygen Therapy: Patient Spontanous Breathing and Patient connected to face mask oxygen  Post-op Assessment: Report given to RN and Post -op Vital signs reviewed and stable  Post vital signs: Reviewed and stable  Last Vitals:  Vitals:   05/04/16 1210 05/04/16 1211  BP: 128/79 128/79  Pulse: 63 62  Resp: 18 16  Temp: 36.4 C     Last Pain:  Vitals:   05/04/16 1014  TempSrc: Oral         Complications: No apparent anesthesia complications

## 2016-05-04 NOTE — Op Note (Signed)
OPERATIVE REPORT  PREOPERATIVE DIAGNOSIS:  Chronic cholecystitis cholelithiasis  POSTOPERATIVE DIAGNOSIS: Chronic cholecystitis cholelithiasis  PROCEDURE: Laparoscopic cholecystectomy   ANESTHESIA: General  SURGEON: Rochel Brome M.D.  INDICATIONS: She has a history of episodes of right upper quadrant pain and also pains in the right subscapular area. She did have ultrasound findings of gallstones. Surgery was recommended for definitive treatment.    With the patient on the operating table in the supine position under general endotracheal anesthesia the abdomen was prepared with ChloraPrep solution and draped in a sterile manner. A short incision was made in the inferior aspect of the umbilicus and carried down to the deep fascia which was grasped with a laryngeal hook. A Veress needle was inserted aspirated and irrigated with a saline solution. The peritoneal cavity was insufflated with carbon dioxide. The Veress needle was removed. The 10 mm cannula was inserted. The 10 mm 0 laparoscope was inserted to view the peritoneal cavity.  Another incision was made in the epigastrium slightly to the right of the midline to introduce an 11 mm cannula. 2 incisions were made in the lateral aspect of the right upper quadrant to introduce 2   5 mm cannulas. Initial survey revealed smooth surface of the liver and some mild gaseous distention of the stomach. I did have the anesthetist insert an oral gastric tube to decompress his stomach. The gallbladder was retracted towards the right shoulder.  The gallbladder neck was retracted inferiorly and laterally.  The porta hepatis was identified. The gallbladder was mobilized with incision of the visceral peritoneum. The cystic duct was dissected free from surrounding structures. The cystic artery was dissected free from surrounding structures. A critical view of safety was demonstrated  An Endo Clip was placed across the cystic duct adjacent to the gallbladder neck. An  incision was made in the cystic duct to introduce a Reddick catheter. The Reddick catheter would not thread more than 4 mm into the cystic duct therefore a cholangiogram was not done.. The Reddick catheter was removed. The cystic duct was doubly ligated with endoclips and divided. The cystic artery was controlled with double endoclips and divided. The gallbladder was dissected free from the liver with use of hook and cautery and blunt dissection. Bleeding was minimal and hemostasis was intact. The gallbladder was delivered up through the infraumbilical incision opened and suctioned. One stone was removed with the stone forceps and submitted for routine pathology with the gallbladder. The right upper quadrant was further inspected and irrigated and aspirated and could see hemostasis was intact. The cannulas were removed allowing carbon dioxide to escape from the peritoneal cavity. The skin incisions were closed with interrupted 5-0 chromic subcutaneous suture benzoin and Steri-Strips. Gauze dressings were applied with paper tape.  The patient appeared to be in satisfactory condition and was prepared for transfer to the recovery room  [place].D.

## 2016-05-04 NOTE — Progress Notes (Signed)
Sore throat  Dr Randa Lynn checked throat    Pt sleepy but arousable

## 2016-05-04 NOTE — H&P (Signed)
  She reports no change in overall condition since the office visit. She has had no severe pain since the office visit.  Her recent serum potassium was 3.7.  I discussed the plan for laparoscopic cholecystectomy.

## 2016-05-04 NOTE — Anesthesia Procedure Notes (Signed)
Procedure Name: Intubation Performed by: Zeanna Sunde Pre-anesthesia Checklist: Patient identified, Patient being monitored, Timeout performed, Emergency Drugs available and Suction available Patient Re-evaluated:Patient Re-evaluated prior to inductionOxygen Delivery Method: Circle system utilized Preoxygenation: Pre-oxygenation with 100% oxygen Intubation Type: IV induction Ventilation: Mask ventilation without difficulty Laryngoscope Size: Mac and 3 Grade View: Grade I Tube type: Oral Tube size: 7.0 mm Number of attempts: 1 Airway Equipment and Method: Stylet Placement Confirmation: ETT inserted through vocal cords under direct vision,  positive ETCO2 and breath sounds checked- equal and bilateral Secured at: 22 cm Tube secured with: Tape Dental Injury: Teeth and Oropharynx as per pre-operative assessment        

## 2016-05-04 NOTE — Progress Notes (Signed)
Sore throat   Ice chips given

## 2016-05-04 NOTE — Anesthesia Postprocedure Evaluation (Signed)
Anesthesia Post Note  Patient: Betty Parsons  Procedure(s) Performed: Procedure(s) (LRB): LAPAROSCOPIC CHOLECYSTECTOMY (N/A)  Patient location during evaluation: PACU Anesthesia Type: General Level of consciousness: awake Pain management: pain level controlled Vital Signs Assessment: post-procedure vital signs reviewed and stable Respiratory status: spontaneous breathing Cardiovascular status: stable Anesthetic complications: no    Last Vitals:  Vitals:   05/04/16 1220 05/04/16 1230  BP: 131/81 134/76  Pulse: 65 66  Resp: 13 17  Temp:      Last Pain:  Vitals:   05/04/16 1210  TempSrc:   PainSc: Asleep                 VAN STAVEREN,[name]

## 2016-05-07 LAB — SURGICAL PATHOLOGY

## 2017-04-30 ENCOUNTER — Emergency Department: Payer: BC Managed Care – PPO

## 2017-04-30 ENCOUNTER — Encounter: Payer: Self-pay | Admitting: Emergency Medicine

## 2017-04-30 ENCOUNTER — Emergency Department
Admission: EM | Admit: 2017-04-30 | Discharge: 2017-04-30 | Disposition: A | Discharge status: Home / Self Care | Payer: BC Managed Care – PPO | Attending: Student in an Organized Health Care Education/Training Program | Admitting: Student in an Organized Health Care Education/Training Program

## 2017-04-30 DIAGNOSIS — I10 Essential (primary) hypertension: Secondary | ICD-10-CM | POA: Diagnosis not present

## 2017-04-30 DIAGNOSIS — R079 Chest pain, unspecified: Secondary | ICD-10-CM | POA: Diagnosis present

## 2017-04-30 DIAGNOSIS — Z79899 Other long term (current) drug therapy: Secondary | ICD-10-CM | POA: Insufficient documentation

## 2017-04-30 DIAGNOSIS — R002 Palpitations: Secondary | ICD-10-CM | POA: Diagnosis not present

## 2017-04-30 DIAGNOSIS — I493 Ventricular premature depolarization: Secondary | ICD-10-CM

## 2017-04-30 LAB — CBC
HCT: 40.9 % (ref 35.0–47.0)
HEMOGLOBIN: 14.1 g/dL (ref 12.0–16.0)
MCH: 29.1 pg (ref 26.0–34.0)
MCHC: 34.4 g/dL (ref 32.0–36.0)
MCV: 84.5 fL (ref 80.0–100.0)
PLATELETS: 357 10*3/uL (ref 150–440)
RBC: 4.83 MIL/uL (ref 3.80–5.20)
RDW: 14 % (ref 11.5–14.5)
WBC: 7 10*3/uL (ref 3.6–11.0)

## 2017-04-30 LAB — BASIC METABOLIC PANEL
Anion gap: 9 (ref 5–15)
BUN: 16 mg/dL (ref 6–20)
CALCIUM: 9.9 mg/dL (ref 8.9–10.3)
CHLORIDE: 104 mmol/L (ref 101–111)
CO2: 25 mmol/L (ref 22–32)
CREATININE: 0.87 mg/dL (ref 0.44–1.00)
GFR calc Af Amer: >60 mL/min (ref >60)
GFR calc non Af Amer: >60 mL/min (ref >60)
GLUCOSE: 111 mg/dL — AB (ref 65–99)
Potassium: 3.8 mmol/L (ref 3.5–5.1)
Sodium: 138 mmol/L (ref 135–145)

## 2017-04-30 LAB — TROPONIN I: Troponin I: <0.03 ng/mL (ref <0.03)

## 2017-04-30 MED ORDER — LORAZEPAM 1 MG PO TABS
1.0000 mg | ORAL_TABLET | Freq: Once | ORAL | Status: AC
  Administered 2017-04-30: 1 mg via ORAL
  Filled 2017-04-30: qty 1

## 2017-04-30 NOTE — ED Provider Notes (Signed)
[facility] Emergency Department Provider Note    First MD Initiated Contact with Patient 04/30/17 1655     (approximate)  I have reviewed the triage vital signs and the nursing notes.   HISTORY  Chief Complaint Chest Pain    HPI Betty Parsons is a 61 y.o. female with a history of anxiety on Cymbalta presents with heart palpitations and chest pressure. States the palpitations started on Sunday that she woke up this morning feeling chest pressure. There is no exertional component. No worsening shortness of breath. Denies any fevers. No cough. Denies any orthopnea. No previous history of heart attack. No family history of early cardiac disease. States she has been stressed out lately caring for her elderly parents. Denies any pleuritic chest pain. There is no radiation of the pain through to her back. Denies any abdominal pain.   Past Medical History:  Diagnosis Date  . Arthritis   . Cancer (HCC)    BASAL CELL  . Complication of anesthesia   . GERD (gastroesophageal reflux disease)   . Headache    H/O  . Hypertension   . PONV (postoperative nausea and vomiting)    No family history on file. Past Surgical History:  Procedure Laterality Date  . BACK SURGERY    . BREAST SURGERY     BREAST REDUCTION  . CHOLECYSTECTOMY N/A 05/04/2016   Procedure: LAPAROSCOPIC CHOLECYSTECTOMY;  Surgeon: Leonie Green, MD;  Location: [facility] ORS;  Service: General;  Laterality: N/A;  . TUBAL LIGATION     There are no active problems to display for this patient.     Prior to Admission medications   Medication Sig Start Date End Date Taking? Authorizing Provider  ALPRAZolam Duanne Moron) 0.5 MG tablet Take 0.25 mg by mouth as needed for anxiety.    [provider]  calcium carbonate (TUMS - DOSED IN MG ELEMENTAL CALCIUM) 500 MG chewable tablet Chew 1 tablet by mouth as needed for indigestion or heartburn.    [provider]  Cholecalciferol (VITAMIN D3)  2000 units capsule Take 2,000 Units by mouth daily.    [provider]  DULoxetine (CYMBALTA) 30 MG capsule Take 30 mg by mouth every morning.    [provider]  HYDROcodone-acetaminophen (NORCO) 5-325 MG tablet Take 1-2 tablets by mouth every 4 (four) hours as needed for moderate pain. 05/04/16   Leonie Green, MD  losartan-hydrochlorothiazide [facility]) 100-12.5 MG tablet Take 1 tablet by mouth every morning.    [provider]  Melatonin 1 MG TABS Take 1 tablet by mouth as needed.    [provider]  metoprolol succinate (TOPROL-XL) 50 MG 24 hr tablet Take 50 mg by mouth every morning. Take with or immediately following a meal.    [provider]    Allergies Patient has no known allergies.    Social History Social History  Substance Use Topics  . Smoking status: Never Smoker  . Smokeless tobacco: Never Used  . Alcohol use Yes     Comment: RARE    Review of Systems Patient denies headaches, rhinorrhea, blurry vision, numbness, shortness of breath, chest pain, edema, cough, abdominal pain, nausea, vomiting, diarrhea, dysuria, fevers, rashes or hallucinations unless otherwise stated above in HPI. ____________________________________________   PHYSICAL EXAM:  VITAL SIGNS: Vitals:   04/30/17 1639  BP: (!) 142/86  Pulse: 87  Resp: 18  Temp: 98.6 F (37 C)    Constitutional: Alert and oriented. Well appearing and in no acute  distress. Eyes: Conjunctivae are normal.  Head: Atraumatic. Nose: No congestion/rhinnorhea. Mouth/Throat: Mucous membranes are moist.   Neck: No stridor. Painless ROM.  Cardiovascular: Normal rate, regular rhythm. Grossly normal heart sounds.  Good peripheral circulation. Respiratory: Normal respiratory effort.  No retractions. Lungs CTAB. Gastrointestinal: Soft and nontender. No distention. No abdominal bruits. No CVA tenderness. Genitourinary:  Musculoskeletal: No lower extremity tenderness nor  edema.  No joint effusions. Neurologic:  Normal speech and language. No gross focal neurologic deficits are appreciated. No facial droop Skin:  Skin is warm, dry and intact. No rash noted. Psychiatric: Mood and affect are normal. Speech and behavior are normal.  ____________________________________________   LABS  (all labs ordered are listed, but only abnormal results are displayed)  Results for orders placed or performed during the hospital encounter of 04/30/17 (from the past 24 hour(s))  Basic metabolic panel     Status: Abnormal   Collection Time: 04/30/17  4:38 PM  Result Value Ref Range   Sodium 138 135 - 145 mmol/L   Potassium 3.8 3.5 - 5.1 mmol/L   Chloride 104 101 - 111 mmol/L   CO2 25 22 - 32 mmol/L   Glucose, Bld 111 (H) 65 - 99 mg/dL   BUN 16 6 - 20 mg/dL   Creatinine, Ser 0.87 0.44 - 1.00 mg/dL   Calcium 9.9 8.9 - 10.3 mg/dL   GFR calc non Af Amer >60 >60 mL/min   GFR calc Af Amer >60 >60 mL/min   Anion gap 9 5 - 15  CBC     Status: None   Collection Time: 04/30/17  4:38 PM  Result Value Ref Range   WBC 7.0 3.6 - 11.0 K/uL   RBC 4.83 3.80 - 5.20 MIL/uL   Hemoglobin 14.1 12.0 - 16.0 g/dL   HCT 40.9 35.0 - 47.0 %   MCV 84.5 80.0 - 100.0 fL   MCH 29.1 26.0 - 34.0 pg   MCHC 34.4 32.0 - 36.0 g/dL   RDW 14.0 11.5 - 14.5 %   Platelets 357 150 - 440 K/uL  Troponin I     Status: None   Collection Time: 04/30/17  4:38 PM  Result Value Ref Range   Troponin I <0.03 <0.03 ng/mL  Troponin I     Status: None   Collection Time: 04/30/17  7:49 PM  Result Value Ref Range   Troponin I <0.03 <0.03 ng/mL   ____________________________________________  EKG My review and personal interpretation at Time: 16:35   Indication: chest discomfort  Rate: 85  Rhythm: sinus Axis: normal Other: normal intervals, poor rwave progression, no stemi or depressions, occasional pvc ____________________________________________  RADIOLOGY  I personally reviewed all radiographic images  ordered to evaluate for the above acute complaints and reviewed radiology reports and findings.  These findings were personally discussed with the patient.  Please see medical record for radiology report.  ____________________________________________   PROCEDURES  Procedure(s) performed:  Procedures    Critical Care performed: no ____________________________________________   INITIAL IMPRESSION / ASSESSMENT AND PLAN / ED COURSE  Pertinent labs & imaging results that were available during my care of the patient were reviewed by me and considered in my medical decision making (see chart for details).  DDX: ACS, pericarditis, esophagitis, boerhaaves, dissection, pna, bronchitis, costochondritis   Betty Parsons is a 61 y.o. who presents to the ED with palpitations and chest discomfort as described above. She arrives well appearing and in no acute distress. Not Clinically consistent with dissection. EKG shows  occasional PVCs which I think is likely causing the symptom or palpitations. There is no evidence of acute ischemia. She has a heart score of 2. Initial troponin is negative. Based on her age we'll further risk stratify with repeat troponin.  Her abdominal exam is soft and benign. Is not clinically consistent with pulmonary embolism or intra-abdominal process causing referred pain. She does not smoke. There is no evidence of pneumonia or bronchitis.   ----------------------------------------- 5:25 PM on 04/30/2017 -----------------------------------------   OBSERVATION CARE: This patient is being placed under observation care for the following reasons: Chest pain with repeat testing to rule out ischemia   Clinical Course as of Apr 30 2040  Tue Apr 30, 2017  2036 Repeat troponin negative.  Patient remains in NAD.    [PR]    Clinical Course User Index [PR] Merlyn Lot, MD   ----------------------------------------- 8:40 PM on  04/30/2017 -----------------------------------------   END OF OBSERVATION STATUS: After an appropriate period of observation, this patient is being discharged due to the following reason(s):  Patient has remained hemodynamic stable. She is without any signs or symptoms of unstable angina at this time. Her repeat troponin is negative. I do feel patient is stable and appropriate for discharge with follow-up with her appointment with cardiology this week. Patient demonstrates understanding of signs and symptoms for which she should return immediately to the hospital.  Have discussed with the patient and available family all diagnostics and treatments performed thus far and all questions were answered to the best of my ability. The patient demonstrates understanding and agreement with plan.    ____________________________________________   FINAL CLINICAL IMPRESSION(S) / ED DIAGNOSES  Final diagnoses:  Palpitations  PVC (premature ventricular contraction)  Chest pain, unspecified type      NEW MEDICATIONS STARTED DURING THIS VISIT:  New Prescriptions   No medications on file     Note:  This document was prepared using Dragon voice recognition software and may include unintentional dictation errors.    Merlyn Lot, MD 04/30/17 2041

## 2017-04-30 NOTE — ED Notes (Signed)
Pt to ed with c/o constant chest pain and pressure that started today.  Reports recent anxiety and personal issues.  Pt reports pain is not reproduced by palpation or movement and does not get better with rest.  Reports mild SOB.  Family with pt at this time, skin warm and dry.  Pt alert and oriented.

## 2017-04-30 NOTE — ED Triage Notes (Signed)
Pt presents with chest pressure today, states some anxiety at this time with family. Pt states feels like her heart is skipping a beat.

## 2018-05-26 ENCOUNTER — Ambulatory Visit
Admission: RE | Admit: 2018-05-26 | Discharge: 2018-05-26 | Disposition: A | Discharge status: Home / Self Care | Payer: BC Managed Care – PPO | Source: Ambulatory Visit | Attending: Physician Assistant | Admitting: Physician Assistant

## 2018-05-26 ENCOUNTER — Other Ambulatory Visit: Payer: Self-pay | Admitting: Physician Assistant

## 2018-05-26 DIAGNOSIS — M79661 Pain in right lower leg: Secondary | ICD-10-CM | POA: Insufficient documentation

## 2018-10-01 HISTORY — PX: COLONOSCOPY: SHX174

## 2018-11-30 DIAGNOSIS — Z1371 Encounter for nonprocreative screening for genetic disease carrier status: Secondary | ICD-10-CM

## 2018-11-30 HISTORY — DX: Encounter for nonprocreative screening for genetic disease carrier status: Z13.71

## 2018-12-03 NOTE — Progress Notes (Signed)
PCP: Rusty Aus, MD   Chief Complaint  Patient presents with  . Gynecologic Exam    HPI:      [name] is a 63 y.o. No obstetric history on file. who LMP was No LMP recorded. Patient is postmenopausal., presents today for her annual examination.  Her menses are absent and she is postmenopausal. She does not have PMB. She does not have vasomotor sx.   Sex activity: single partner, contraception - post menopausal status. She does have vaginal dryness.  Last Pap: August 11, 2015  Results were: no abnormalities /neg HPV DNA. S/p LEEP many yrs ago.  Hx of STDs: HPV  Last mammogram: August 11, 2015  Results were: normal--routine follow-up in 12 months There is no FH of breast cancer. There is a possible FH of ovarian cancer in her mom (did monthly treatments for ovarian pathology for many yrs). There is a FH of pancreatic cancer in her mat aunt and cousin, genetic testing not done. The patient does do self-breast exams.  Colonoscopy: colonoscopy 2020 with non-cancerous polyps.  Repeat due after 5 years.   Tobacco use: The patient denies current or previous tobacco use. Alcohol use: none Exercise: min active  She does get adequate calcium and Vitamin D in her diet.  Labs with PCP.   Past Medical History:  Diagnosis Date  . Arthritis   . Cancer (HCC)    BASAL CELL  . Colon polyps   . Complication of anesthesia   . GERD (gastroesophageal reflux disease)   . Headache    H/O  . Hypertension   . PONV (postoperative nausea and vomiting)     Past Surgical History:  Procedure Laterality Date  . BACK SURGERY    . BREAST SURGERY     BREAST REDUCTION  . CHOLECYSTECTOMY N/A 05/04/2016   Procedure: LAPAROSCOPIC CHOLECYSTECTOMY;  Surgeon: Leonie Green, MD;  Location: [facility] ORS;  Service: General;  Laterality: N/A;  . COLONOSCOPY  2020   repeat due in 5 yrs due to polyps  . LEEP  2006  . TUBAL LIGATION      Family History  Problem Relation Age of Onset  .  Hypertension Mother   . Osteoporosis Mother   . Ovarian cancer Mother        possible, did monthly tx at cancer ctr  . Hypertension Father   . Bladder Cancer Father 21  . Pancreatic cancer Other   . Pancreatic cancer Maternal Aunt 70  . Leukemia Maternal Aunt     Social History   Socioeconomic History  . Marital status: Divorced    Spouse name: Not on file  . Number of children: Not on file  . Years of education: Not on file  . Highest education level: Not on file  Occupational History  . Not on file  Social Needs  . Financial resource strain: Not on file  . Food insecurity:    Worry: Not on file    Inability: Not on file  . Transportation needs:    Medical: Not on file    Non-medical: Not on file  Tobacco Use  . Smoking status: Never Smoker  . Smokeless tobacco: Never Used  Substance and Sexual Activity  . Alcohol use: Yes    Comment: RARE  . Drug use: No  . Sexual activity: Yes    Birth control/protection: Post-menopausal  Lifestyle  . Physical activity:    Days per week: Not on file    Minutes per  session: Not on file  . Stress: Not on file  Relationships  . Social connections:    Talks on phone: Not on file    Gets together: Not on file    Attends religious service: Not on file    Active member of club or organization: Not on file    Attends meetings of clubs or organizations: Not on file    Relationship status: Not on file  . Intimate partner violence:    Fear of current or ex partner: Not on file    Emotionally abused: Not on file    Physically abused: Not on file    Forced sexual activity: Not on file  Other Topics Concern  . Not on file  Social History Narrative  . Not on file    Outpatient Medications Prior to Visit  Medication Sig Dispense Refill  . ALPRAZolam (XANAX) 0.5 MG tablet Take 0.25 mg by mouth as needed for anxiety.    . calcium carbonate (TUMS - DOSED IN MG ELEMENTAL CALCIUM) 500 MG chewable tablet Chew 1 tablet by mouth as needed  for indigestion or heartburn.    . Cholecalciferol (VITAMIN D3) 2000 units capsule Take 2,000 Units by mouth daily.    . DULoxetine (CYMBALTA) 30 MG capsule Take 30 mg by mouth every morning.    Marland Kitchen losartan-hydrochlorothiazide (HYZAAR) 100-12.5 MG tablet Take 1 tablet by mouth every morning.    . Magnesium Oxide 250 MG TABS Take by mouth.    . metoprolol succinate (TOPROL-XL) 50 MG 24 hr tablet Take 50 mg by mouth every morning. Take with or immediately following a meal.    . omeprazole (PRILOSEC OTC) 20 MG tablet Take by mouth.    . telmisartan-hydrochlorothiazide (MICARDIS HCT) 40-12.5 MG tablet Take 1 tablet by mouth daily.    Marland Kitchen HYDROcodone-acetaminophen (NORCO) 5-325 MG tablet Take 1-2 tablets by mouth every 4 (four) hours as needed for moderate pain. 12 tablet 0  . Melatonin 1 MG TABS Take 1 tablet by mouth as needed.     No facility-administered medications prior to visit.        ROS:  Review of Systems  Constitutional: Negative for fatigue, fever and unexpected weight change.  Respiratory: Negative for cough, shortness of breath and wheezing.   Cardiovascular: Negative for chest pain, palpitations and leg swelling.  Gastrointestinal: Negative for blood in stool, constipation, diarrhea, nausea and vomiting.  Endocrine: Negative for cold intolerance, heat intolerance and polyuria.  Genitourinary: Negative for dyspareunia, dysuria, flank pain, frequency, genital sores, hematuria, menstrual problem, pelvic pain, urgency, vaginal bleeding, vaginal discharge and vaginal pain.  Musculoskeletal: Negative for back pain, joint swelling and myalgias.  Skin: Negative for rash.  Neurological: Negative for dizziness, syncope, light-headedness, numbness and headaches.  Hematological: Negative for adenopathy.  Psychiatric/Behavioral: Negative for agitation, confusion, sleep disturbance and suicidal ideas. The patient is not nervous/anxious.    BREAST: No symptoms    Objective: BP 100/80    Ht 5\' 5"  (1.651 m)   Wt 197 lb (89.4 kg)   BMI 32.78 kg/m    Physical Exam Constitutional:      Appearance: She is well-developed.  Genitourinary:     Vulva, vagina, cervix, uterus, right adnexa and left adnexa normal.     No vulval lesion or tenderness noted.     No vaginal discharge, erythema or tenderness.     No cervical polyp.     Uterus is not enlarged or tender.     No right or left adnexal  mass present.     Right adnexa not tender.     Left adnexa not tender.  Neck:     Musculoskeletal: Normal range of motion.     Thyroid: No thyromegaly.  Cardiovascular:     Rate and Rhythm: Normal rate and regular rhythm.     Heart sounds: Normal heart sounds. No murmur.  Pulmonary:     Effort: Pulmonary effort is normal.     Breath sounds: Normal breath sounds.  Chest:     Breasts:        Right: No mass, nipple discharge, skin change or tenderness.        Left: No mass, nipple discharge, skin change or tenderness.  Abdominal:     Palpations: Abdomen is soft.     Tenderness: There is no abdominal tenderness. There is no guarding.  Musculoskeletal: Normal range of motion.  Neurological:     General: No focal deficit present.     Mental Status: She is alert and oriented to person, place, and time.     Cranial Nerves: No cranial nerve deficit.  Skin:    General: Skin is warm.  Psychiatric:        Mood and Affect: Mood normal.        Behavior: Behavior normal.        Thought Content: Thought content normal.        Judgment: Judgment normal.  Vitals signs reviewed.     Assessment/Plan:  Encounter for annual routine gynecological examination  Cervical cancer screening - Plan: Cytology - PAP  Screening for HPV (human papillomavirus) - Plan: Cytology - PAP  Screening for breast cancer - Pt to sched mammo - Plan: MM 3D SCREEN BREAST BILATERAL  Family history of pancreatic cancer - MyRisk testing discussed and done today. Will call with reuslts.           GYN counsel  breast self exam, mammography screening, menopause, adequate intake of calcium and vitamin D, diet and exercise    F/U  Return in about 1 year (around 12/04/2019).   B. , PA-C 12/04/2018 10:55 AM

## 2018-12-04 ENCOUNTER — Other Ambulatory Visit (HOSPITAL_COMMUNITY)
Admission: RE | Admit: 2018-12-04 | Discharge: 2018-12-04 | Disposition: A | Discharge status: Home / Self Care | Payer: BC Managed Care – PPO | Source: Ambulatory Visit | Attending: Obstetrics and Gynecology | Admitting: Obstetrics and Gynecology

## 2018-12-04 ENCOUNTER — Encounter: Payer: Self-pay | Admitting: Obstetrics and Gynecology

## 2018-12-04 ENCOUNTER — Ambulatory Visit (INDEPENDENT_AMBULATORY_CARE_PROVIDER_SITE_OTHER): Payer: BC Managed Care – PPO | Admitting: Obstetrics and Gynecology

## 2018-12-04 VITALS — BP 100/80 | Ht 65.0 in | Wt 197.0 lb

## 2018-12-04 DIAGNOSIS — Z1151 Encounter for screening for human papillomavirus (HPV): Secondary | ICD-10-CM | POA: Insufficient documentation

## 2018-12-04 DIAGNOSIS — Z1239 Encounter for other screening for malignant neoplasm of breast: Secondary | ICD-10-CM

## 2018-12-04 DIAGNOSIS — Z124 Encounter for screening for malignant neoplasm of cervix: Secondary | ICD-10-CM | POA: Insufficient documentation

## 2018-12-04 DIAGNOSIS — Z800 Family history of malignant neoplasm of digestive organs: Secondary | ICD-10-CM

## 2018-12-04 DIAGNOSIS — Z01419 Encounter for gynecological examination (general) (routine) without abnormal findings: Secondary | ICD-10-CM

## 2018-12-04 NOTE — Patient Instructions (Signed)
I value your feedback and entrusting [place] with your care. If you get a [facility] patient survey, I would appreciate you taking the time to let us know about your experience today. Thank you!  [place] at [facility]: [place] [facility] [facility]: [phone]

## 2018-12-05 LAB — CYTOLOGY - PAP
Diagnosis: NEGATIVE
HPV (WINDOPATH): NOT DETECTED

## 2018-12-16 ENCOUNTER — Encounter: Payer: Self-pay | Admitting: Obstetrics and Gynecology

## 2018-12-22 ENCOUNTER — Telehealth: Payer: Self-pay | Admitting: Obstetrics and Gynecology

## 2018-12-22 NOTE — Telephone Encounter (Signed)
Pt aware of neg MyRisk results and 2 ATM VUS. Nothing further to do at this time.   Patient understands these results only apply to her and her children, and this is not indicative of genetic testing results of her other family members. It is recommended that her other family members have genetic testing done.  Pt also understands negative genetic testing doesn't mean she will never get any of these cancers.   Hard copy mailed to pt. F/u prn.

## 2020-05-19 ENCOUNTER — Other Ambulatory Visit: Payer: Self-pay | Admitting: Internal Medicine

## 2020-05-19 DIAGNOSIS — Z1231 Encounter for screening mammogram for malignant neoplasm of breast: Secondary | ICD-10-CM

## 2020-07-04 ENCOUNTER — Encounter: Payer: Self-pay | Admitting: Obstetrics and Gynecology

## 2020-07-04 NOTE — Progress Notes (Signed)
PCP: Rusty Aus, MD   Chief Complaint  Patient presents with  . Gynecologic Exam  . Injections    flu shot    HPI:      Betty Parsons is a 64 y.o. No obstetric history on file. who LMP was No LMP recorded. Patient is postmenopausal., presents today for her annual examination.  Her menses are absent and she is postmenopausal. She does not have PMB. She does not have vasomotor sx.   Sex activity: single partner, contraception - post menopausal status. She does have vaginal dryness. Hasn't tried lubricants.  Last Pap: 12/04/18 Results were: no abnormalities /neg HPV DNA. S/p LEEP many yrs ago.  Hx of STDs: HPV  Last mammogram: August 11, 2015  Results were: normal--routine follow-up in 12 months. Has mammo today.  There is no FH of breast cancer. There is a possible FH of ovarian cancer in her mom (did monthly treatments for ovarian pathology for many yrs). There is a FH of pancreatic cancer in her mat aunt and cousin. Pt is MyRisk neg except 2 ATM VUS 11/2018; IBIS=5.2%/riskscore=5.7% The patient does occas do self-breast exams.  Colonoscopy: colonoscopy 10/2018 with non-cancerous polyps.  Repeat due after 5 years.   Tobacco use: The patient denies current or previous tobacco use. Alcohol use: none  No drug use Exercise: min active  She does get adequate calcium and Vitamin D in her diet.  Labs with PCP.   Past Medical History:  Diagnosis Date  . Arthritis   . BRCA negative 11/2018   MyRisk neg except 2 ATM VUS; IBIS=5.2%/riskscore=5.7%  . Cancer (HCC)    BASAL CELL  . Colon polyps   . Complication of anesthesia   . Family history of ovarian cancer   . Family history of pancreatic cancer   . GERD (gastroesophageal reflux disease)   . Headache    H/O  . Hypertension   . PONV (postoperative nausea and vomiting)     Past Surgical History:  Procedure Laterality Date  . BACK SURGERY    . BREAST SURGERY     BREAST REDUCTION  . CHOLECYSTECTOMY N/A 05/04/2016    Procedure: LAPAROSCOPIC CHOLECYSTECTOMY;  Surgeon: Leonie Green, MD;  Location: [facility] ORS;  Service: General;  Laterality: N/A;  . COLONOSCOPY  2020   repeat due in 5 yrs due to polyps  . LEEP  2006  . TUBAL LIGATION      Family History  Problem Relation Age of Onset  . Hypertension Mother   . Osteoporosis Mother   . Ovarian cancer Mother        possible, did monthly tx at cancer ctr  . Hypertension Father   . Bladder Cancer Father 38  . Pancreatic cancer Other   . Pancreatic cancer Maternal Aunt 70  . Leukemia Maternal Aunt     Social History   Socioeconomic History  . Marital status: Divorced    Spouse name: Not on file  . Number of children: Not on file  . Years of education: Not on file  . Highest education level: Not on file  Occupational History  . Not on file  Tobacco Use  . Smoking status: Never Smoker  . Smokeless tobacco: Never Used  Vaping Use  . Vaping Use: Never used  Substance and Sexual Activity  . Alcohol use: Yes    Comment: RARE  . Drug use: No  . Sexual activity: Yes    Birth control/protection: Post-menopausal  Other Topics Concern  .  Not on file  Social History Narrative  . Not on file   Social Determinants of Health   Financial Resource Strain:   . Difficulty of Paying Living Expenses: Not on file  Food Insecurity:   . Worried About Charity fundraiser in the Last Year: Not on file  . Ran Out of Food in the Last Year: Not on file  Transportation Needs:   . Lack of Transportation (Medical): Not on file  . Lack of Transportation (Non-Medical): Not on file  Physical Activity:   . Days of Exercise per Week: Not on file  . Minutes of Exercise per Session: Not on file  Stress:   . Feeling of Stress : Not on file  Social Connections:   . Frequency of Communication with Friends and Family: Not on file  . Frequency of Social Gatherings with Friends and Family: Not on file  . Attends Religious Services: Not on file  . Active Member of  Clubs or Organizations: Not on file  . Attends Archivist Meetings: Not on file  . Marital Status: Not on file  Intimate Partner Violence:   . Fear of Current or Ex-Partner: Not on file  . Emotionally Abused: Not on file  . Physically Abused: Not on file  . Sexually Abused: Not on file    Outpatient Medications Prior to Visit  Medication Sig Dispense Refill  . ALPRAZolam (XANAX) 0.5 MG tablet Take 0.25 mg by mouth as needed for anxiety.    . Cholecalciferol (VITAMIN D3) 2000 units capsule Take 2,000 Units by mouth daily.    . DULoxetine (CYMBALTA) 20 MG capsule Take 20 mg by mouth daily.    Marland Kitchen losartan-hydrochlorothiazide (HYZAAR) 100-12.5 MG tablet Take 1 tablet by mouth every morning.    . Magnesium Oxide 250 MG TABS Take by mouth.    . metoprolol succinate (TOPROL-XL) 50 MG 24 hr tablet Take 50 mg by mouth every morning. Take with or immediately following a meal.    . omeprazole (PRILOSEC OTC) 20 MG tablet Take by mouth.    . telmisartan-hydrochlorothiazide (MICARDIS HCT) 40-12.5 MG tablet Take 1 tablet by mouth daily.    . calcium carbonate (TUMS - DOSED IN MG ELEMENTAL CALCIUM) 500 MG chewable tablet Chew 1 tablet by mouth as needed for indigestion or heartburn.    . DULoxetine (CYMBALTA) 30 MG capsule Take 30 mg by mouth every morning.     No facility-administered medications prior to visit.       ROS:  Review of Systems  Constitutional: Negative for fatigue, fever and unexpected weight change.  Respiratory: Negative for cough, shortness of breath and wheezing.   Cardiovascular: Negative for chest pain, palpitations and leg swelling.  Gastrointestinal: Negative for blood in stool, constipation, diarrhea, nausea and vomiting.  Endocrine: Negative for cold intolerance, heat intolerance and polyuria.  Genitourinary: Negative for dyspareunia, dysuria, flank pain, frequency, genital sores, hematuria, menstrual problem, pelvic pain, urgency, vaginal bleeding, vaginal  discharge and vaginal pain.  Musculoskeletal: Negative for back pain, joint swelling and myalgias.  Skin: Negative for rash.  Neurological: Negative for dizziness, syncope, light-headedness, numbness and headaches.  Hematological: Negative for adenopathy.  Psychiatric/Behavioral: Positive for agitation. Negative for confusion, sleep disturbance and suicidal ideas. The patient is not nervous/anxious.    BREAST: No symptoms    Objective: BP 100/70   Ht _0  (1.651 m)   Wt 205 lb (93 kg)   BMI 34.11 kg/m    Physical Exam Constitutional:  Appearance: She is well-developed.  Genitourinary:     Vulva, vagina, cervix, uterus, right adnexa and left adnexa normal.     No vulval lesion or tenderness noted.     No vaginal discharge, erythema or tenderness.     No cervical polyp.     Uterus is not enlarged or tender.     No right or left adnexal mass present.     Right adnexa not tender.     Left adnexa not tender.  Neck:     Thyroid: No thyromegaly.  Cardiovascular:     Rate and Rhythm: Normal rate and regular rhythm.     Heart sounds: Normal heart sounds. No murmur heard.   Pulmonary:     Effort: Pulmonary effort is normal.     Breath sounds: Normal breath sounds.  Chest:     Breasts:        Right: No mass, nipple discharge, skin change or tenderness.        Left: No mass, nipple discharge, skin change or tenderness.  Abdominal:     Palpations: Abdomen is soft.     Tenderness: There is no abdominal tenderness. There is no guarding.  Musculoskeletal:        General: Normal range of motion.     Cervical back: Normal range of motion.  Neurological:     General: No focal deficit present.     Mental Status: She is alert and oriented to person, place, and time.     Cranial Nerves: No cranial nerve deficit.  Skin:    General: Skin is warm and dry.  Psychiatric:        Mood and Affect: Mood normal.        Behavior: Behavior normal.        Thought Content: Thought content  normal.        Judgment: Judgment normal.  Vitals reviewed.     Assessment/Plan:  Encounter for annual routine gynecological examination  Encounter for screening mammogram for malignant neoplasm of breast--pt has mammo today  Family history of pancreatic cancer--Pt is MyRisk neg; nothing further to do at this time          GYN counsel breast self exam, mammography screening, menopause, adequate intake of calcium and vitamin D, diet and exercise    F/U  Return in about 1 year (around 07/05/2021).  Betty Gierke B. Sophiea Ueda, PA-C 07/05/2020 8:35 AM

## 2020-07-05 ENCOUNTER — Encounter: Payer: Self-pay | Admitting: Obstetrics and Gynecology

## 2020-07-05 ENCOUNTER — Ambulatory Visit
Admission: RE | Admit: 2020-07-05 | Discharge: 2020-07-05 | Disposition: A | Discharge status: Home / Self Care | Payer: BC Managed Care – PPO | Source: Ambulatory Visit | Attending: Internal Medicine | Admitting: Internal Medicine

## 2020-07-05 ENCOUNTER — Other Ambulatory Visit: Payer: Self-pay

## 2020-07-05 ENCOUNTER — Ambulatory Visit (INDEPENDENT_AMBULATORY_CARE_PROVIDER_SITE_OTHER): Payer: BC Managed Care – PPO | Admitting: Obstetrics and Gynecology

## 2020-07-05 VITALS — BP 100/70 | Ht 65.0 in | Wt 205.0 lb

## 2020-07-05 DIAGNOSIS — Z800 Family history of malignant neoplasm of digestive organs: Secondary | ICD-10-CM | POA: Diagnosis not present

## 2020-07-05 DIAGNOSIS — Z1231 Encounter for screening mammogram for malignant neoplasm of breast: Secondary | ICD-10-CM | POA: Diagnosis not present

## 2020-07-05 DIAGNOSIS — Z01419 Encounter for gynecological examination (general) (routine) without abnormal findings: Secondary | ICD-10-CM | POA: Diagnosis not present

## 2020-07-05 NOTE — Patient Instructions (Signed)
I value your feedback and entrusting us with your care. If you get a [facility] patient survey, I would appreciate you taking the time to let us know about your experience today. Thank you!  As of September 10, 2019, your lab results will be released to your MyChart immediately, before I even have a chance to see them. Please give me time to review them and contact you if there are any abnormalities. Thank you for your patience.  

## 2021-07-03 ENCOUNTER — Other Ambulatory Visit: Payer: Self-pay | Admitting: Internal Medicine

## 2021-07-03 DIAGNOSIS — Z1231 Encounter for screening mammogram for malignant neoplasm of breast: Secondary | ICD-10-CM

## 2021-07-14 ENCOUNTER — Ambulatory Visit (INDEPENDENT_AMBULATORY_CARE_PROVIDER_SITE_OTHER): Payer: BC Managed Care – PPO | Admitting: Obstetrics and Gynecology

## 2021-07-14 ENCOUNTER — Ambulatory Visit
Admission: RE | Admit: 2021-07-14 | Discharge: 2021-07-14 | Disposition: A | Discharge status: Home / Self Care | Payer: BC Managed Care – PPO | Source: Ambulatory Visit | Attending: Internal Medicine | Admitting: Internal Medicine

## 2021-07-14 ENCOUNTER — Other Ambulatory Visit: Payer: Self-pay

## 2021-07-14 ENCOUNTER — Encounter: Payer: Self-pay | Admitting: Obstetrics and Gynecology

## 2021-07-14 VITALS — BP 106/62 | Ht 65.0 in | Wt 198.0 lb

## 2021-07-14 DIAGNOSIS — B3731 Acute candidiasis of vulva and vagina: Secondary | ICD-10-CM

## 2021-07-14 DIAGNOSIS — Z01419 Encounter for gynecological examination (general) (routine) without abnormal findings: Secondary | ICD-10-CM | POA: Diagnosis not present

## 2021-07-14 DIAGNOSIS — Z1231 Encounter for screening mammogram for malignant neoplasm of breast: Secondary | ICD-10-CM | POA: Diagnosis present

## 2021-07-14 LAB — POCT WET PREP WITH KOH
Clue Cells Wet Prep HPF POC: NEGATIVE
KOH Prep POC: NEGATIVE
Trichomonas, UA: NEGATIVE
Yeast Wet Prep HPF POC: POSITIVE

## 2021-07-14 MED ORDER — FLUCONAZOLE 150 MG PO TABS: 150.0000 mg | ORAL_TABLET | Freq: Once | ORAL | 0 refills | Status: AC

## 2021-07-14 NOTE — Patient Instructions (Signed)
I value your feedback and you entrusting us with your care. If you get a  patient survey, I would appreciate you taking the time to let us know about your experience today. Thank you! ? ? ?

## 2021-07-14 NOTE — Progress Notes (Signed)
PCP: Rusty Aus, MD   Chief Complaint  Patient presents with   Gynecologic Exam    No concerns    HPI:      [name] is a 65 y.o. No obstetric history on file. who LMP was No LMP recorded. Patient is postmenopausal., presents today for her annual examination.  Her menses are absent and she is postmenopausal. She does not have PMB. She does not have vasomotor sx.   Sex activity: single partner, contraception - post menopausal status. She does have vaginal dryness. Hasn't tried lubricants.  Last Pap: 12/04/18 Results were: no abnormalities /neg HPV DNA. S/p LEEP many yrs ago.  Hx of STDs: HPV  Has had increased vag d/c off and on this yr, no itching/irritation/fishy odor. No recent yeast tx; takes AZO probiotics prn. Used to get them yrs ago.   Last mammogram: 07/05/20 Results were: normal--routine follow-up in 12 months. Has mammo today.  There is no FH of breast cancer. There is a possible FH of ovarian cancer in her mom (did monthly treatments for ovarian pathology for many yrs). There is a FH of pancreatic cancer in her mat aunt and cousin. Pt is MyRisk neg except 2 ATM VUS 11/2018; IBIS=5.2%/riskscore=5.7% The patient does self-breast exams.  Colonoscopy: colonoscopy 10/2018 with non-cancerous polyps.  Repeat due after 5 years.   Tobacco use: The patient denies current or previous tobacco use. Alcohol use: none  No drug use Exercise: min active  She does get adequate calcium and Vitamin D in her diet.  Labs with PCP.   Past Medical History:  Diagnosis Date   Arthritis    BRCA negative 11/2018   MyRisk neg except 2 ATM VUS; IBIS=5.2%/riskscore=5.7%   Cancer (HCC)    BASAL CELL   Colon polyps    Complication of anesthesia    Family history of ovarian cancer    Family history of pancreatic cancer    GERD (gastroesophageal reflux disease)    Headache    H/O   Hypertension    PONV (postoperative nausea and vomiting)     Past Surgical History:  Procedure  Laterality Date   BACK SURGERY     BREAST SURGERY     BREAST REDUCTION   CHOLECYSTECTOMY N/A 05/04/2016   Procedure: LAPAROSCOPIC CHOLECYSTECTOMY;  Surgeon: Leonie Green, MD;  Location: [facility] ORS;  Service: General;  Laterality: N/A;   COLONOSCOPY  2020   repeat due in 5 yrs due to polyps   LEEP  2006   REDUCTION MAMMAPLASTY Bilateral yrs ago   TUBAL LIGATION      Family History  Problem Relation Age of Onset   Hypertension Mother    Osteoporosis Mother    Ovarian cancer Mother        possible, did monthly tx at cancer ctr   Hypertension Father    Bladder Cancer Father 35   Pancreatic cancer Other    Pancreatic cancer Maternal Aunt 65   Leukemia Maternal Aunt     Social History   Socioeconomic History   Marital status: Divorced    Spouse name: Not on file   Number of children: Not on file   Years of education: Not on file   Highest education level: Not on file  Occupational History   Not on file  Tobacco Use   Smoking status: Never   Smokeless tobacco: Never  Vaping Use   Vaping Use: Never used  Substance and Sexual Activity   Alcohol use: Yes  Comment: RARE   Drug use: No   Sexual activity: Yes    Birth control/protection: Post-menopausal  Other Topics Concern   Not on file  Social History Narrative   Not on file   Social Determinants of Health   Financial Resource Strain: Not on file  Food Insecurity: Not on file  Transportation Needs: Not on file  Physical Activity: Not on file  Stress: Not on file  Social Connections: Not on file  Intimate Partner Violence: Not on file    Outpatient Medications Prior to Visit  Medication Sig Dispense Refill   ALPRAZolam (XANAX) 0.5 MG tablet Take 0.25 mg by mouth as needed for anxiety.     Cholecalciferol (VITAMIN D3) 2000 units capsule Take 2,000 Units by mouth daily.     DULoxetine (CYMBALTA) 20 MG capsule Take 20 mg by mouth daily.     losartan-hydrochlorothiazide (HYZAAR) 100-12.5 MG tablet Take 1  tablet by mouth every morning.     Magnesium Oxide 250 MG TABS Take by mouth.     metoprolol succinate (TOPROL-XL) 50 MG 24 hr tablet Take 50 mg by mouth every morning. Take with or immediately following a meal.     omeprazole (PRILOSEC OTC) 20 MG tablet Take by mouth.     telmisartan-hydrochlorothiazide (MICARDIS HCT) 40-12.5 MG tablet Take 1 tablet by mouth daily.     No facility-administered medications prior to visit.     ROS:  Review of Systems  Constitutional:  Negative for fatigue, fever and unexpected weight change.  Respiratory:  Negative for cough, shortness of breath and wheezing.   Cardiovascular:  Negative for chest pain, palpitations and leg swelling.  Gastrointestinal:  Negative for blood in stool, constipation, diarrhea, nausea and vomiting.  Endocrine: Negative for cold intolerance, heat intolerance and polyuria.  Genitourinary:  Negative for dyspareunia, dysuria, flank pain, frequency, genital sores, hematuria, menstrual problem, pelvic pain, urgency, vaginal bleeding, vaginal discharge and vaginal pain.  Musculoskeletal:  Negative for back pain, joint swelling and myalgias.  Skin:  Negative for rash.  Neurological:  Negative for dizziness, syncope, light-headedness, numbness and headaches.  Hematological:  Negative for adenopathy.  Psychiatric/Behavioral:  Negative for agitation, confusion, sleep disturbance and suicidal ideas. The patient is not nervous/anxious.   BREAST: No symptoms    Objective: BP 106/62   Ht $R'5\' 5"'pt$  (1.651 m)   Wt 198 lb (89.8 kg)   BMI 32.95 kg/m    Physical Exam Constitutional:      Appearance: She is well-developed.  Genitourinary:     Vulva normal.     Right Labia: No rash, tenderness or lesions.    Left Labia: No tenderness, lesions or rash.    Vaginal discharge present.     No vaginal erythema or tenderness.      Right Adnexa: not tender and no mass present.    Left Adnexa: not tender and no mass present.    No cervical  friability or polyp.     Uterus is not enlarged or tender.  Breasts:    Right: No mass, nipple discharge, skin change or tenderness.     Left: No mass, nipple discharge, skin change or tenderness.  Neck:     Thyroid: No thyromegaly.  Cardiovascular:     Rate and Rhythm: Normal rate and regular rhythm.     Heart sounds: Normal heart sounds. No murmur heard. Pulmonary:     Effort: Pulmonary effort is normal.     Breath sounds: Normal breath sounds.  Abdominal:  Palpations: Abdomen is soft.     Tenderness: There is no abdominal tenderness. There is no guarding or rebound.  Musculoskeletal:        General: Normal range of motion.     Cervical back: Normal range of motion.  Lymphadenopathy:     Cervical: No cervical adenopathy.  Neurological:     General: No focal deficit present.     Mental Status: She is alert and oriented to person, place, and time.     Cranial Nerves: No cranial nerve deficit.  Skin:    General: Skin is warm and dry.  Psychiatric:        Mood and Affect: Mood normal.        Behavior: Behavior normal.        Thought Content: Thought content normal.        Judgment: Judgment normal.  Vitals reviewed.    Results for orders placed or performed in visit on 07/14/21 (from the past 24 hour(s))  POCT Wet Prep with KOH     Status: Abnormal   Collection Time: 07/14/21 10:13 AM  Result Value Ref Range   Trichomonas, UA Negative    Clue Cells Wet Prep HPF POC neg    Epithelial Wet Prep HPF POC     Yeast Wet Prep HPF POC pos    Bacteria Wet Prep HPF POC     RBC Wet Prep HPF POC     WBC Wet Prep HPF POC     KOH Prep POC Negative Negative    Assessment/Plan: Encounter for annual routine gynecological examination  Encounter for screening mammogram for malignant neoplasm of breast--pt has appt today  Candidal vaginitis - Plan: fluconazole (DIFLUCAN) 150 MG tablet, POCT Wet Prep with KOH; pos sx and wet prep. Rx diflucan prn.   Family history of pancreatic  cancer--Pt is MyRisk neg; nothing further to do at this time  Meds ordered this encounter  Medications   fluconazole (DIFLUCAN) 150 MG tablet    Sig: Take 1 tablet (150 mg total) by mouth once for 1 dose.    Dispense:  1 tablet    Refill:  0    Order Specific Question:   Supervising Provider    Answer:   Gae Dry [[id number]]           GYN counsel breast self exam, mammography screening, adequate intake of calcium and vitamin D, diet and exercise    F/U  Return in about 1 year (around 07/14/2022).  Bartt Gonzaga B. Kenzie Flakes, PA-C 07/14/2021 10:15 AM

## 2021-12-29 IMAGING — MG MM DIGITAL SCREENING BILAT W/ TOMO AND CAD
6 of 10 series · 6 of 30 positions shown · non-contrast
Comparison: Previous exam(s).

CLINICAL DATA: Screening.

EXAM:
DIGITAL SCREENING BILATERAL MAMMOGRAM WITH TOMOSYNTHESIS AND CAD
TECHNIQUE: Bilateral screening digital craniocaudal and mediolateral oblique
mammograms were obtained. Bilateral screening digital breast
tomosynthesis was performed. The images were evaluated with
computer-aided detection.

[L CC synth-2D (1 of 2)]
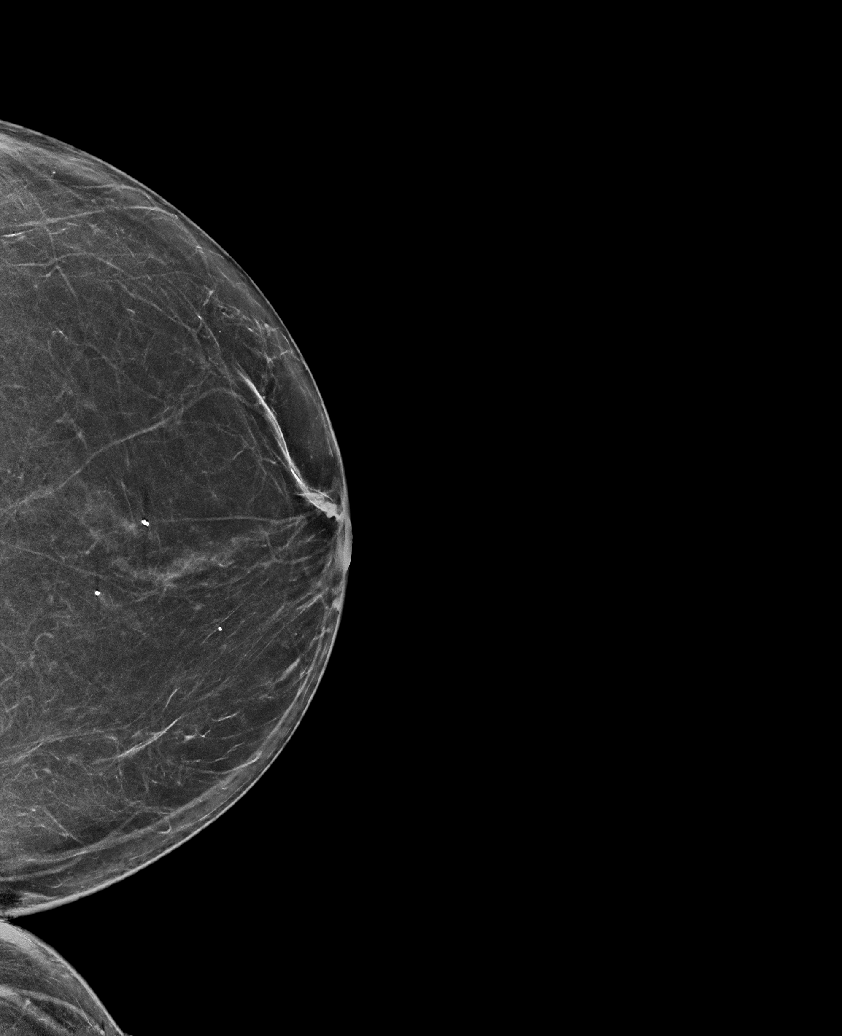

[R CC synth-2D]
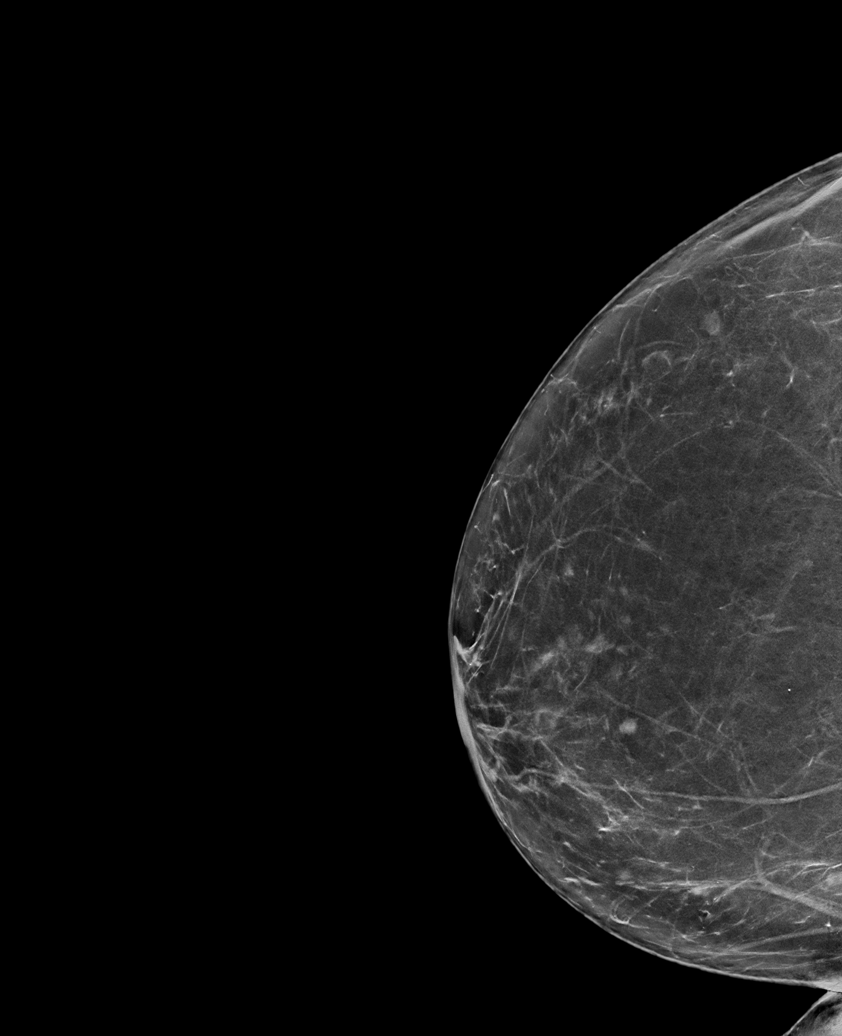

[L CC synth-2D (2 of 2)]
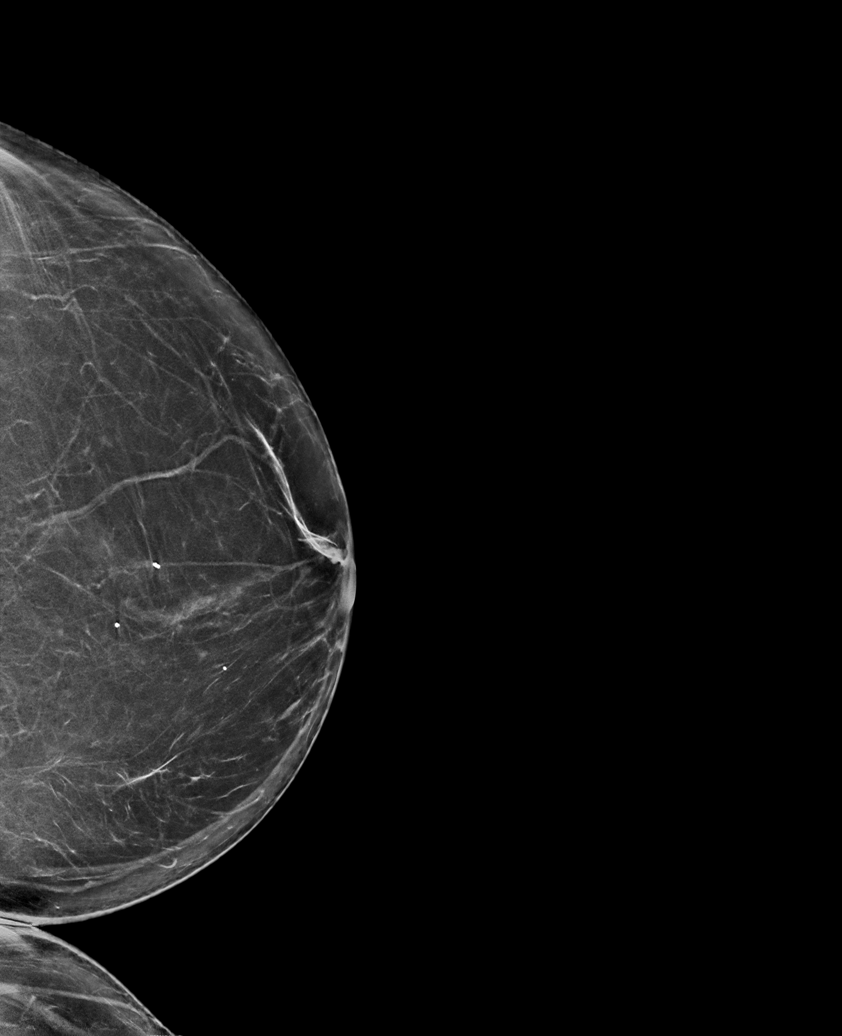

[L MLO synth-2D]
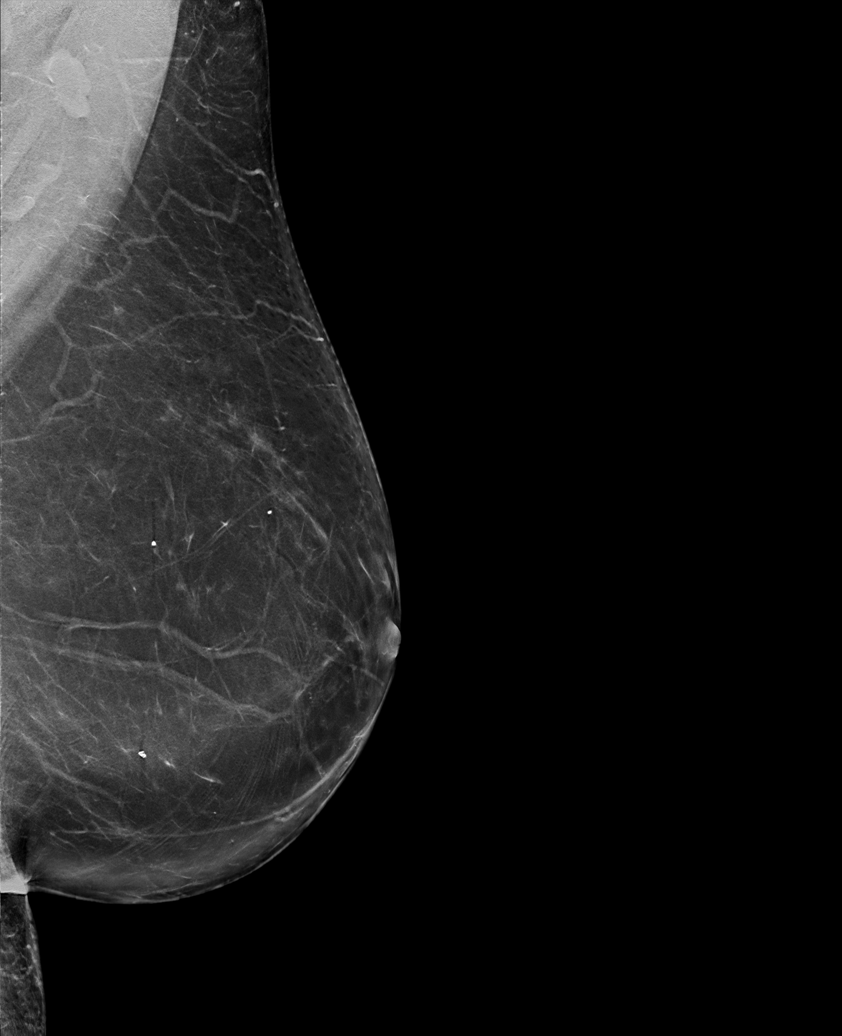

[R MLO synth-2D]
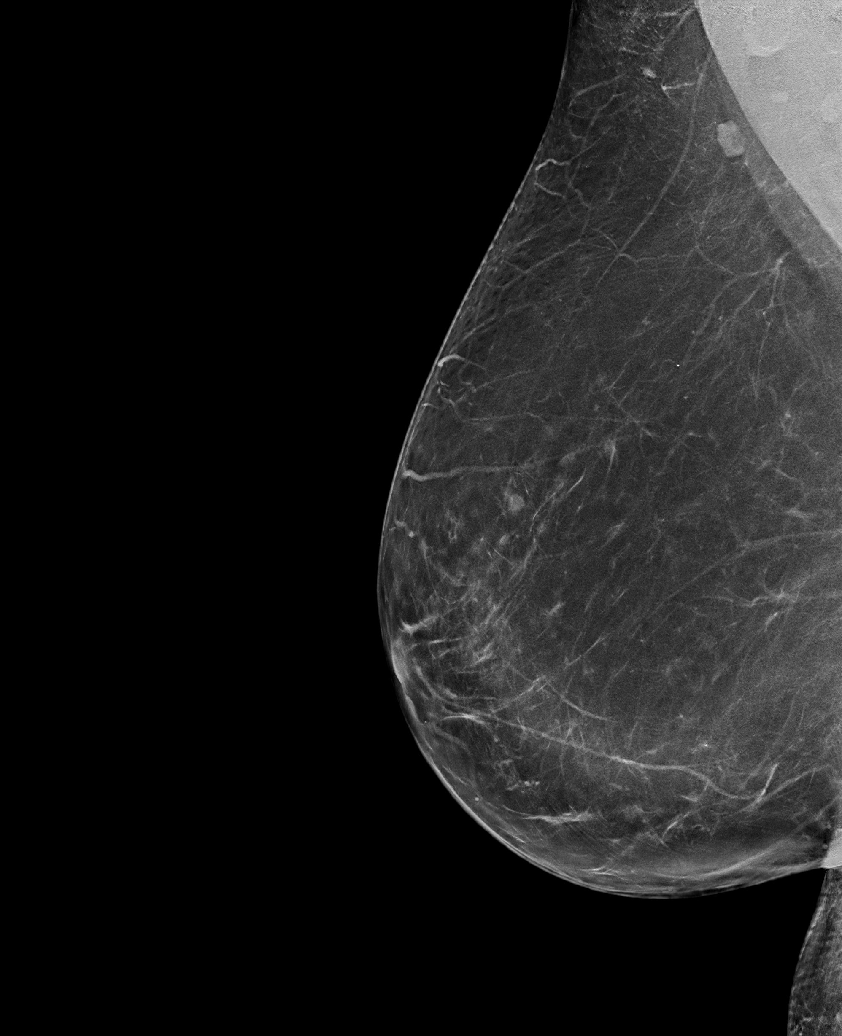

[L MLO tomo · tomo slice 40/79.0]
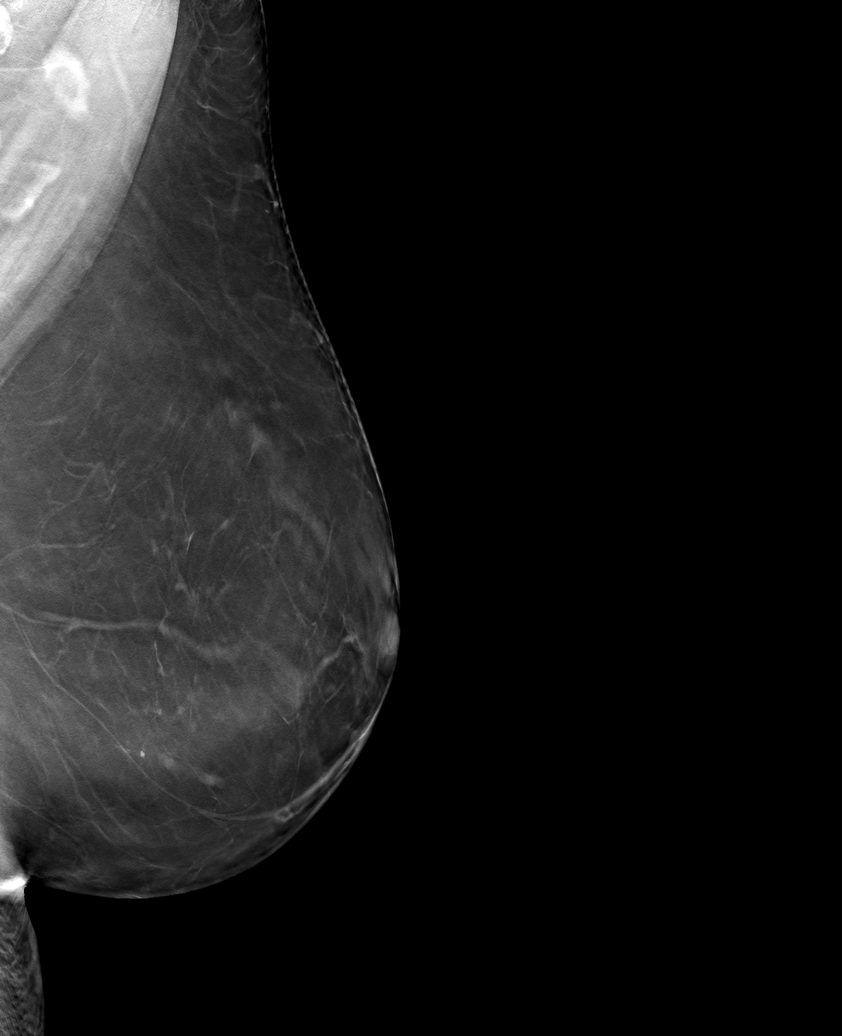

[6 of 30 positions shown; findings below may reference images not displayed]

ACR Breast Density Category b: There are scattered areas of
fibroglandular density.
FINDINGS: There are no findings suspicious for malignancy.
IMPRESSION: No mammographic evidence of malignancy. A result letter of this
screening mammogram will be mailed directly to the patient.

RECOMMENDATION:
Screening mammogram in one year. (Code:51-O-LD2)

BI-RADS CATEGORY  1: Negative.

## 2022-10-10 ENCOUNTER — Other Ambulatory Visit: Payer: Self-pay | Admitting: Internal Medicine

## 2022-10-10 DIAGNOSIS — Z1231 Encounter for screening mammogram for malignant neoplasm of breast: Secondary | ICD-10-CM

## 2022-11-01 ENCOUNTER — Ambulatory Visit
Admission: RE | Admit: 2022-11-01 | Discharge: 2022-11-01 | Disposition: A | Discharge status: Home / Self Care | Payer: Medicare HMO | Source: Ambulatory Visit | Attending: Internal Medicine | Admitting: Internal Medicine

## 2022-11-01 DIAGNOSIS — Z1231 Encounter for screening mammogram for malignant neoplasm of breast: Secondary | ICD-10-CM | POA: Diagnosis present

## 2022-11-08 ENCOUNTER — Other Ambulatory Visit: Payer: Self-pay | Admitting: Internal Medicine

## 2022-11-08 DIAGNOSIS — R928 Other abnormal and inconclusive findings on diagnostic imaging of breast: Secondary | ICD-10-CM

## 2022-11-08 DIAGNOSIS — N6489 Other specified disorders of breast: Secondary | ICD-10-CM

## 2022-11-13 ENCOUNTER — Ambulatory Visit
Admission: RE | Admit: 2022-11-13 | Discharge: 2022-11-13 | Disposition: A | Discharge status: Home / Self Care | Payer: Medicare HMO | Source: Ambulatory Visit | Attending: Internal Medicine | Admitting: Internal Medicine

## 2022-11-13 DIAGNOSIS — R928 Other abnormal and inconclusive findings on diagnostic imaging of breast: Secondary | ICD-10-CM

## 2022-11-13 DIAGNOSIS — N6489 Other specified disorders of breast: Secondary | ICD-10-CM | POA: Insufficient documentation

## 2023-03-13 DIAGNOSIS — Z87410 Personal history of cervical dysplasia: Secondary | ICD-10-CM | POA: Insufficient documentation

## 2023-03-13 NOTE — Progress Notes (Signed)
PCP: Danella Penton, MD   Chief Complaint  Patient presents with   Gynecologic Exam    No concerns    HPI:      [name] is a 68 y.o. No obstetric history on file. who LMP was No LMP recorded. Patient is postmenopausal., presents today for her annual examination.  Her menses are absent and she is postmenopausal. She does not have PMB. She does have vasomotor sx.   Sex activity: infrequent with single partner, contraception - post menopausal status. She does have vaginal dryness. Hasn't tried lubricants.  Last Pap: 12/04/18 Results were: no abnormalities /neg HPV DNA. S/p LEEP 2006. Neg paps since.  Hx of STDs: HPV  Hx of recurrent yeast vag sx since I saw her last yr; treated with diflucan then. Changed to dove sens skin soap and sx improved. No dryer sheets, wearing cotton underwear. Was on abx a few months ago.   Last mammogram: 11/13/22 Results were: normal after RT breast u/s--routine follow-up in 12 months. There is no FH of breast cancer. There is a possible FH of ovarian cancer in her mom (did monthly treatments for ovarian pathology for many yrs). There is a FH of pancreatic cancer in her mat aunt and cousin. Pt is MyRisk neg except 2 ATM VUS 11/2018; IBIS=5.2%/riskscore=5.7% The patient does self-breast exams.  Colonoscopy: colonoscopy 10/2018 with non-cancerous polyps.  Repeat due after 5 years.  DEXA: none  Tobacco use: The patient denies current or previous tobacco use. Alcohol use: none  No drug use Exercise: mod active  She does get adequate calcium and Vitamin D in her diet.  Labs with PCP.   Past Medical History:  Diagnosis Date   Arthritis    BRCA negative 11/2018   MyRisk neg except 2 ATM VUS; IBIS=5.2%/riskscore=5.7%   Cancer (HCC)    BASAL CELL   Colon polyps    Complication of anesthesia    Family history of ovarian cancer    Family history of pancreatic cancer    GERD (gastroesophageal reflux disease)    Headache    H/O   Hypertension     PONV (postoperative nausea and vomiting)     Past Surgical History:  Procedure Laterality Date   BACK SURGERY     BREAST SURGERY     BREAST REDUCTION   CHOLECYSTECTOMY N/A 05/04/2016   Procedure: LAPAROSCOPIC CHOLECYSTECTOMY;  Surgeon: Nadeen Landau, MD;  Location: [facility] ORS;  Service: General;  Laterality: N/A;   COLONOSCOPY  2020   repeat due in 5 yrs due to polyps   LEEP  2006   REDUCTION MAMMAPLASTY Bilateral yrs ago   TUBAL LIGATION      Family History  Problem Relation Age of Onset   Hypertension Mother    Osteoporosis Mother    Ovarian cancer Mother        possible, did monthly tx at cancer ctr   Hypertension Father    Bladder Cancer Father 35   Pancreatic cancer Other    Pancreatic cancer Maternal Aunt 14   Leukemia Maternal Aunt     Social History   Socioeconomic History   Marital status: Divorced    Spouse name: Not on file   Number of children: Not on file   Years of education: Not on file   Highest education level: Not on file  Occupational History   Not on file  Tobacco Use   Smoking status: Never   Smokeless tobacco: Never  Vaping Use  Vaping Use: Never used  Substance and Sexual Activity   Alcohol use: Yes    Comment: RARE   Drug use: No   Sexual activity: Yes    Birth control/protection: Post-menopausal  Other Topics Concern   Not on file  Social History Narrative   Not on file   Social Determinants of Health   Financial Resource Strain: Not on file  Food Insecurity: Not on file  Transportation Needs: Not on file  Physical Activity: Not on file  Stress: Not on file  Social Connections: Not on file  Intimate Partner Violence: Not on file    Outpatient Medications Prior to Visit  Medication Sig Dispense Refill   Cholecalciferol (VITAMIN D3) 2000 units capsule Take 2,000 Units by mouth daily.     metoprolol succinate (TOPROL-XL) 50 MG 24 hr tablet Take 50 mg by mouth every morning. Take with or immediately following a meal.      omeprazole (PRILOSEC OTC) 20 MG tablet Take by mouth.     phentermine 15 MG capsule Take 15 mg by mouth every morning.     losartan-hydrochlorothiazide (HYZAAR) 100-12.5 MG tablet Take 1 tablet by mouth every morning.     telmisartan-hydrochlorothiazide (MICARDIS HCT) 40-12.5 MG tablet Take 1 tablet by mouth daily.     ALPRAZolam (XANAX) 0.5 MG tablet Take 0.25 mg by mouth as needed for anxiety.     DULoxetine (CYMBALTA) 20 MG capsule Take 20 mg by mouth daily.     Magnesium Oxide 250 MG TABS Take by mouth.     No facility-administered medications prior to visit.     ROS:  Review of Systems  Constitutional:  Negative for fatigue, fever and unexpected weight change.  Respiratory:  Negative for cough, shortness of breath and wheezing.   Cardiovascular:  Negative for chest pain, palpitations and leg swelling.  Gastrointestinal:  Negative for blood in stool, constipation, diarrhea, nausea and vomiting.  Endocrine: Negative for cold intolerance, heat intolerance and polyuria.  Genitourinary:  Positive for dyspareunia and vaginal discharge. Negative for dysuria, flank pain, frequency, genital sores, hematuria, menstrual problem, pelvic pain, urgency, vaginal bleeding and vaginal pain.  Musculoskeletal:  Negative for back pain, joint swelling and myalgias.  Skin:  Negative for rash.  Neurological:  Negative for dizziness, syncope, light-headedness, numbness and headaches.  Hematological:  Negative for adenopathy.  Psychiatric/Behavioral:  Negative for agitation, confusion, sleep disturbance and suicidal ideas. The patient is not nervous/anxious.    BREAST: No symptoms    Objective: BP 110/66   Ht 5\' 5"  (1.651 m)   Wt 199 lb (90.3 kg)   BMI 33.12 kg/m    Physical Exam Constitutional:      Appearance: She is well-developed.  Genitourinary:     Vulva normal.     Right Labia: No rash, tenderness or lesions.    Left Labia: No tenderness, lesions or rash.    Vaginal discharge  present.     No vaginal erythema or tenderness.     Moderate vaginal atrophy present.     Right Adnexa: not tender and no mass present.    Left Adnexa: not tender and no mass present.    No cervical friability or polyp.     Uterus is not enlarged or tender.  Breasts:    Right: No mass, nipple discharge, skin change or tenderness.     Left: No mass, nipple discharge, skin change or tenderness.  Neck:     Thyroid: No thyromegaly.  Cardiovascular:     Rate  and Rhythm: Normal rate and regular rhythm.     Heart sounds: Normal heart sounds. No murmur heard. Pulmonary:     Effort: Pulmonary effort is normal.     Breath sounds: Normal breath sounds.  Abdominal:     Palpations: Abdomen is soft.     Tenderness: There is no abdominal tenderness. There is no guarding or rebound.  Musculoskeletal:        General: Normal range of motion.     Cervical back: Normal range of motion.  Lymphadenopathy:     Cervical: No cervical adenopathy.  Neurological:     General: No focal deficit present.     Mental Status: She is alert and oriented to person, place, and time.     Cranial Nerves: No cranial nerve deficit.  Skin:    General: Skin is warm and dry.  Psychiatric:        Mood and Affect: Mood normal.        Behavior: Behavior normal.        Thought Content: Thought content normal.        Judgment: Judgment normal.  Vitals reviewed.     Results for orders placed or performed in visit on 03/14/23 (from the past 24 hour(s))  POCT Wet Prep with KOH     Status: Abnormal   Collection Time: 03/14/23 11:21 AM  Result Value Ref Range   Trichomonas, UA Negative    Clue Cells Wet Prep HPF POC neg    Epithelial Wet Prep HPF POC     Yeast Wet Prep HPF POC few    Bacteria Wet Prep HPF POC     RBC Wet Prep HPF POC     WBC Wet Prep HPF POC     KOH Prep POC Negative Negative     Assessment/Plan: Encounter for annual routine gynecological examination  Cervical cancer screening - Plan: Cytology  - PAP  Screening for HPV (human papillomavirus) - Plan: Cytology - PAP  History of cervical dysplasia - Plan: Cytology - PAP; repeat pap today.   Encounter for screening mammogram for malignant neoplasm of breast - Plan: MM 3D SCREENING MAMMOGRAM BILATERAL BREAST; pt to schedule mammo 2/25  Screening for colon cancer--pt due for colonoscopy 1/25. Call for ref prn vs just schedule herself  Screening for osteoporosis - Plan: DG Bone Density; pt to schedule with mammo  Candidal vaginitis - Plan: fluconazole (DIFLUCAN) 150 MG tablet, POCT Wet Prep with KOH; pos sx and wet prep. Rx diflucan. F/u prn.   Dyspareunia in female--has mod atrophy. Add lubricants. F/u prn vag ERT.    Meds ordered this encounter  Medications   fluconazole (DIFLUCAN) 150 MG tablet    Sig: Take 1 tablet (150 mg total) by mouth once for 1 dose.    Dispense:  1 tablet    Refill:  1    Order Specific Question:   Supervising Provider    Answer:   Waymon Budge           GYN counsel breast self exam, mammography screening, adequate intake of calcium and vitamin D, diet and exercise    F/U  Return in about 1 year (around 03/13/2024).  Aminat Shelburne B. Janele Lague, PA-C 03/14/2023 11:22 AM

## 2023-03-14 ENCOUNTER — Other Ambulatory Visit (HOSPITAL_COMMUNITY)
Admission: RE | Admit: 2023-03-14 | Discharge: 2023-03-14 | Disposition: A | Discharge status: Home / Self Care | Payer: Medicare HMO | Source: Ambulatory Visit | Attending: Obstetrics and Gynecology | Admitting: Obstetrics and Gynecology

## 2023-03-14 ENCOUNTER — Encounter: Payer: Self-pay | Admitting: Obstetrics and Gynecology

## 2023-03-14 ENCOUNTER — Ambulatory Visit (INDEPENDENT_AMBULATORY_CARE_PROVIDER_SITE_OTHER): Payer: Medicare HMO | Admitting: Obstetrics and Gynecology

## 2023-03-14 VITALS — BP 110/66 | Ht 65.0 in | Wt 199.0 lb

## 2023-03-14 DIAGNOSIS — Z1231 Encounter for screening mammogram for malignant neoplasm of breast: Secondary | ICD-10-CM

## 2023-03-14 DIAGNOSIS — Z87410 Personal history of cervical dysplasia: Secondary | ICD-10-CM

## 2023-03-14 DIAGNOSIS — Z01419 Encounter for gynecological examination (general) (routine) without abnormal findings: Secondary | ICD-10-CM

## 2023-03-14 DIAGNOSIS — Z124 Encounter for screening for malignant neoplasm of cervix: Secondary | ICD-10-CM | POA: Diagnosis present

## 2023-03-14 DIAGNOSIS — B3731 Acute candidiasis of vulva and vagina: Secondary | ICD-10-CM | POA: Diagnosis not present

## 2023-03-14 DIAGNOSIS — Z1211 Encounter for screening for malignant neoplasm of colon: Secondary | ICD-10-CM

## 2023-03-14 DIAGNOSIS — Z13820 Encounter for screening for osteoporosis: Secondary | ICD-10-CM

## 2023-03-14 DIAGNOSIS — Z1151 Encounter for screening for human papillomavirus (HPV): Secondary | ICD-10-CM | POA: Insufficient documentation

## 2023-03-14 DIAGNOSIS — N941 Unspecified dyspareunia: Secondary | ICD-10-CM

## 2023-03-14 LAB — POCT WET PREP WITH KOH
Clue Cells Wet Prep HPF POC: NEGATIVE
KOH Prep POC: NEGATIVE
Trichomonas, UA: NEGATIVE

## 2023-03-14 MED ORDER — FLUCONAZOLE 150 MG PO TABS: 150.0000 mg | ORAL_TABLET | Freq: Once | ORAL | 1 refills | Status: AC

## 2023-03-14 NOTE — Patient Instructions (Addendum)
I value your feedback and you entrusting us with your care. If you get a [facility] patient survey, I would appreciate you taking the time to let us know about your experience today. Thank you!  [facility] ([place])--[phone]  

## 2023-03-15 LAB — CYTOLOGY - PAP
Adequacy: ABSENT
Diagnosis: NEGATIVE

## 2023-07-18 ENCOUNTER — Ambulatory Visit
Admission: RE | Admit: 2023-07-18 | Discharge: 2023-07-18 | Disposition: A | Discharge status: Home / Self Care | Payer: Medicare HMO | Source: Ambulatory Visit | Attending: Obstetrics and Gynecology | Admitting: Obstetrics and Gynecology

## 2023-07-18 DIAGNOSIS — Z780 Asymptomatic menopausal state: Secondary | ICD-10-CM | POA: Diagnosis not present

## 2023-07-18 DIAGNOSIS — M8589 Other specified disorders of bone density and structure, multiple sites: Secondary | ICD-10-CM | POA: Diagnosis not present

## 2023-07-18 DIAGNOSIS — Z13820 Encounter for screening for osteoporosis: Secondary | ICD-10-CM | POA: Diagnosis present

## 2023-10-09 ENCOUNTER — Other Ambulatory Visit: Payer: Self-pay | Admitting: Internal Medicine

## 2023-10-09 DIAGNOSIS — Z0000 Encounter for general adult medical examination without abnormal findings: Secondary | ICD-10-CM

## 2023-10-09 DIAGNOSIS — E782 Mixed hyperlipidemia: Secondary | ICD-10-CM

## 2023-10-17 ENCOUNTER — Ambulatory Visit
Admission: RE | Admit: 2023-10-17 | Discharge: 2023-10-17 | Disposition: A | Discharge status: Home / Self Care | Payer: Self-pay | Source: Ambulatory Visit | Attending: Internal Medicine | Admitting: Internal Medicine

## 2023-10-17 DIAGNOSIS — Z0000 Encounter for general adult medical examination without abnormal findings: Secondary | ICD-10-CM | POA: Insufficient documentation

## 2023-10-17 DIAGNOSIS — E782 Mixed hyperlipidemia: Secondary | ICD-10-CM | POA: Insufficient documentation

## 2023-11-07 ENCOUNTER — Ambulatory Visit
Admission: RE | Admit: 2023-11-07 | Discharge: 2023-11-07 | Disposition: A | Discharge status: Home / Self Care | Payer: Medicare Other | Source: Ambulatory Visit | Attending: Obstetrics and Gynecology | Admitting: Obstetrics and Gynecology

## 2023-11-07 DIAGNOSIS — Z1231 Encounter for screening mammogram for malignant neoplasm of breast: Secondary | ICD-10-CM | POA: Diagnosis present

## 2023-11-11 ENCOUNTER — Encounter: Payer: Self-pay | Admitting: Obstetrics and Gynecology

## 2024-03-13 ENCOUNTER — Ambulatory Visit: Payer: No Typology Code available for payment source

## 2024-03-13 DIAGNOSIS — D125 Benign neoplasm of sigmoid colon: Secondary | ICD-10-CM | POA: Diagnosis not present

## 2024-03-13 DIAGNOSIS — D124 Benign neoplasm of descending colon: Secondary | ICD-10-CM | POA: Diagnosis not present

## 2024-03-13 DIAGNOSIS — K5730 Diverticulosis of large intestine without perforation or abscess without bleeding: Secondary | ICD-10-CM | POA: Diagnosis not present

## 2024-03-13 DIAGNOSIS — Z09 Encounter for follow-up examination after completed treatment for conditions other than malignant neoplasm: Secondary | ICD-10-CM | POA: Diagnosis present

## 2024-03-13 DIAGNOSIS — D123 Benign neoplasm of transverse colon: Secondary | ICD-10-CM | POA: Diagnosis not present

## 2024-03-13 DIAGNOSIS — Z860101 Personal history of adenomatous and serrated colon polyps: Secondary | ICD-10-CM | POA: Diagnosis not present

## 2024-07-06 NOTE — Progress Notes (Unsigned)
 PCP: Cleotilde Oneil FALCON, MD   No chief complaint on file.   HPI:      [name] is a 68 y.o. No obstetric history on file. who LMP was No LMP recorded. Patient is postmenopausal., presents today for her annual examination.  Her menses are absent and she is postmenopausal. She does not have PMB. She does have vasomotor sx.   Sex activity: infrequent with single partner, contraception - post menopausal status. She does have vaginal dryness. Hasn't tried lubricants.  Last Pap: 03/14/23 Results were: no abnormalities /neg HPV DNA 2020. S/p LEEP 2006. Neg paps since.  Hx of STDs: HPV  Hx of recurrent yeast vag sx since I saw her last yr; treated with diflucan  then. Changed to dove sens skin soap and sx improved. No dryer sheets, wearing cotton underwear. Was on abx a few months ago.   Last mammogram: 11/07/23 Results were: normal after RT breast u/s--routine follow-up in 12 months. There is no FH of breast cancer. There is a possible FH of ovarian cancer in her mom (did monthly treatments for ovarian pathology for many yrs). There is a FH of pancreatic cancer in her mat aunt and cousin. Pt is MyRisk neg except 2 ATM VUS 11/2018; IBIS=5.2%/riskscore=5.7% The patient does self-breast exams.  Colonoscopy: colonoscopy 6/25 at [facility], repeat due after ???/ yrs; 10/2018 with non-cancerous polyps.  Repeat due after 5 years.  DEXA: 10/24 osteopenia in hip  Tobacco use: The patient denies current or previous tobacco use. Alcohol use: none  No drug use Exercise: mod active  She does get adequate calcium and Vitamin D in her diet.  Labs with PCP.   Past Medical History:  Diagnosis Date   Arthritis    BRCA negative 11/2018   MyRisk neg except 2 ATM VUS; IBIS=5.2%/riskscore=5.7%   Cancer (HCC)    BASAL CELL   Colon polyps    Complication of anesthesia    Family history of ovarian cancer    Family history of pancreatic cancer    GERD (gastroesophageal reflux disease)    Headache    H/O    Hypertension    PONV (postoperative nausea and vomiting)     Past Surgical History:  Procedure Laterality Date   BACK SURGERY     BREAST SURGERY     BREAST REDUCTION   CHOLECYSTECTOMY N/A 05/04/2016   Procedure: LAPAROSCOPIC CHOLECYSTECTOMY;  Surgeon: Larinda Unknown Sharps, MD;  Location: [facility] ORS;  Service: General;  Laterality: N/A;   COLONOSCOPY  2020   repeat due in 5 yrs due to polyps   LEEP  2006   REDUCTION MAMMAPLASTY Bilateral yrs ago   TUBAL LIGATION      Family History  Problem Relation Age of Onset   Hypertension Mother    Osteoporosis Mother    Ovarian cancer Mother        possible, did monthly tx at cancer ctr   Hypertension Father    Bladder Cancer Father 29   Pancreatic cancer Maternal Aunt 42   Leukemia Maternal Aunt    Pancreatic cancer Other    Breast cancer Neg Hx     Social History   Socioeconomic History   Marital status: Divorced    Spouse name: Not on file   Number of children: Not on file   Years of education: Not on file   Highest education level: Not on file  Occupational History   Not on file  Tobacco Use   Smoking status: Never  Smokeless tobacco: Never  Vaping Use   Vaping status: Never Used  Substance and Sexual Activity   Alcohol use: Yes    Comment: RARE   Drug use: No   Sexual activity: Yes    Birth control/protection: Post-menopausal  Other Topics Concern   Not on file  Social History Narrative   Not on file   Social Drivers of Health   Financial Resource Strain: Low Risk  (10/07/2023)   Received from [facility]   Overall Financial Resource Strain (CARDIA)    Difficulty of Paying Living Expenses: Not hard at all  Food Insecurity: No Food Insecurity (10/07/2023)   Received from [facility]   Hunger Vital Sign    Within the past 12 months, you worried that your food would run out before you got the money to buy more.: Never true    Within the past 12 months, the food you bought just  didn't last and you didn't have money to get more.: Never true  Transportation Needs: No Transportation Needs (10/07/2023)   Received from [facility] - Transportation    In the past 12 months, has lack of transportation kept you from medical appointments or from getting medications?: No    Lack of Transportation (Non-Medical): No  Physical Activity: Not on file  Stress: Not on file  Social Connections: Not on file  Intimate Partner Violence: Not on file    Outpatient Medications Prior to Visit  Medication Sig Dispense Refill   Cholecalciferol (VITAMIN D3) 2000 units capsule Take 2,000 Units by mouth daily.     losartan-hydrochlorothiazide (HYZAAR) 100-12.5 MG tablet Take 1 tablet by mouth every morning.     metoprolol succinate (TOPROL-XL) 50 MG 24 hr tablet Take 50 mg by mouth every morning. Take with or immediately following a meal.     omeprazole (PRILOSEC OTC) 20 MG tablet Take by mouth.     phentermine 15 MG capsule Take 15 mg by mouth every morning.     telmisartan-hydrochlorothiazide (MICARDIS HCT) 40-12.5 MG tablet Take 1 tablet by mouth daily.     No facility-administered medications prior to visit.     ROS:  Review of Systems  Constitutional:  Negative for fatigue, fever and unexpected weight change.  Respiratory:  Negative for cough, shortness of breath and wheezing.   Cardiovascular:  Negative for chest pain, palpitations and leg swelling.  Gastrointestinal:  Negative for blood in stool, constipation, diarrhea, nausea and vomiting.  Endocrine: Negative for cold intolerance, heat intolerance and polyuria.  Genitourinary:  Positive for dyspareunia and vaginal discharge. Negative for dysuria, flank pain, frequency, genital sores, hematuria, menstrual problem, pelvic pain, urgency, vaginal bleeding and vaginal pain.  Musculoskeletal:  Negative for back pain, joint swelling and myalgias.  Skin:  Negative for rash.  Neurological:  Negative for  dizziness, syncope, light-headedness, numbness and headaches.  Hematological:  Negative for adenopathy.  Psychiatric/Behavioral:  Negative for agitation, confusion, sleep disturbance and suicidal ideas. The patient is not nervous/anxious.    BREAST: No symptoms    Objective: There were no vitals taken for this visit.   Physical Exam Constitutional:      Appearance: She is well-developed.  Genitourinary:     Vulva normal.     Right Labia: No rash, tenderness or lesions.    Left Labia: No tenderness, lesions or rash.    Vaginal discharge present.     No vaginal erythema or tenderness.     Moderate vaginal  atrophy present.     Right Adnexa: not tender and no mass present.    Left Adnexa: not tender and no mass present.    No cervical friability or polyp.     Uterus is not enlarged or tender.  Breasts:    Right: No mass, nipple discharge, skin change or tenderness.     Left: No mass, nipple discharge, skin change or tenderness.  Neck:     Thyroid: No thyromegaly.  Cardiovascular:     Rate and Rhythm: Normal rate and regular rhythm.     Heart sounds: Normal heart sounds. No murmur heard. Pulmonary:     Effort: Pulmonary effort is normal.     Breath sounds: Normal breath sounds.  Abdominal:     Palpations: Abdomen is soft.     Tenderness: There is no abdominal tenderness. There is no guarding or rebound.  Musculoskeletal:        General: Normal range of motion.     Cervical back: Normal range of motion.  Lymphadenopathy:     Cervical: No cervical adenopathy.  Neurological:     General: No focal deficit present.     Mental Status: She is alert and oriented to person, place, and time.     Cranial Nerves: No cranial nerve deficit.  Skin:    General: Skin is warm and dry.  Psychiatric:        Mood and Affect: Mood normal.        Behavior: Behavior normal.        Thought Content: Thought content normal.        Judgment: Judgment normal.  Vitals reviewed.     No  results found for this or any previous visit (from the past 24 hours).    Assessment/Plan: Encounter for annual routine gynecological examination  Cervical cancer screening - Plan: Cytology - PAP  Screening for HPV (human papillomavirus) - Plan: Cytology - PAP  History of cervical dysplasia - Plan: Cytology - PAP; repeat pap today.   Encounter for screening mammogram for malignant neoplasm of breast - Plan: MM 3D SCREENING MAMMOGRAM BILATERAL BREAST; pt to schedule mammo 2/25  Screening for colon cancer--pt due for colonoscopy 1/25. Call for ref prn vs just schedule herself  Screening for osteoporosis - Plan: DG Bone Density; pt to schedule with mammo  Candidal vaginitis - Plan: fluconazole  (DIFLUCAN ) 150 MG tablet, POCT Wet Prep with KOH; pos sx and wet prep. Rx diflucan . F/u prn.   Dyspareunia in female--has mod atrophy. Add lubricants. F/u prn vag ERT.    No orders of the defined types were placed in this encounter.          GYN counsel breast self exam, mammography screening, adequate intake of calcium and vitamin D, diet and exercise    F/U  No follow-ups on file.  Erica Osuna B. Londa Mackowski, PA-C 07/06/2024 12:53 PM

## 2024-07-07 ENCOUNTER — Ambulatory Visit (INDEPENDENT_AMBULATORY_CARE_PROVIDER_SITE_OTHER): Admitting: Obstetrics and Gynecology

## 2024-07-07 ENCOUNTER — Encounter: Payer: Self-pay | Admitting: Obstetrics and Gynecology

## 2024-07-07 VITALS — BP 113/67 | HR 89 | Ht 65.0 in | Wt 203.0 lb

## 2024-07-07 DIAGNOSIS — N941 Unspecified dyspareunia: Secondary | ICD-10-CM

## 2024-07-07 DIAGNOSIS — Z9889 Other specified postprocedural states: Secondary | ICD-10-CM | POA: Insufficient documentation

## 2024-07-07 DIAGNOSIS — Z1211 Encounter for screening for malignant neoplasm of colon: Secondary | ICD-10-CM

## 2024-07-07 DIAGNOSIS — N3946 Mixed incontinence: Secondary | ICD-10-CM | POA: Diagnosis not present

## 2024-07-07 DIAGNOSIS — Z1231 Encounter for screening mammogram for malignant neoplasm of breast: Secondary | ICD-10-CM

## 2024-07-07 DIAGNOSIS — Z01419 Encounter for gynecological examination (general) (routine) without abnormal findings: Secondary | ICD-10-CM

## 2024-07-07 MED ORDER — ESTRADIOL 0.1 MG/GM VA CREA
TOPICAL_CREAM | VAGINAL | 0 refills | Status: AC
Start: 2024-07-07 — End: ?

## 2024-07-07 NOTE — Patient Instructions (Addendum)
 I value your feedback and you entrusting [place] with your care. If you get a [facility] patient survey, I would appreciate you taking the time to let us know about your experience today. Thank you! ? ? ?

## 2024-09-30 ENCOUNTER — Other Ambulatory Visit: Payer: Self-pay | Admitting: Internal Medicine

## 2024-09-30 DIAGNOSIS — N941 Unspecified dyspareunia: Secondary | ICD-10-CM

## 2024-09-30 DIAGNOSIS — N3946 Mixed incontinence: Secondary | ICD-10-CM

## 2024-09-30 DIAGNOSIS — Z1231 Encounter for screening mammogram for malignant neoplasm of breast: Secondary | ICD-10-CM

## 2024-09-30 DIAGNOSIS — Z01419 Encounter for gynecological examination (general) (routine) without abnormal findings: Secondary | ICD-10-CM

## 2024-09-30 DIAGNOSIS — Z9889 Other specified postprocedural states: Secondary | ICD-10-CM

## 2024-11-09 ENCOUNTER — Encounter
